# Patient Record
Sex: Female | Born: 1960 | Race: White | Hispanic: No | Smoking: Never smoker
Health system: Southern US, Community
[De-identification: ages and names within clinical notes are randomized; demographics above are authoritative.]

---

## 2012-06-10 DIAGNOSIS — IMO0001 Reserved for inherently not codable concepts without codable children: Secondary | ICD-10-CM | POA: Insufficient documentation

## 2012-07-29 DIAGNOSIS — S31000A Unspecified open wound of lower back and pelvis without penetration into retroperitoneum, initial encounter: Secondary | ICD-10-CM | POA: Insufficient documentation

## 2013-03-10 DIAGNOSIS — H40059 Ocular hypertension, unspecified eye: Secondary | ICD-10-CM | POA: Insufficient documentation

## 2016-08-21 DIAGNOSIS — R7989 Other specified abnormal findings of blood chemistry: Secondary | ICD-10-CM | POA: Insufficient documentation

## 2016-08-21 DIAGNOSIS — R5383 Other fatigue: Secondary | ICD-10-CM | POA: Insufficient documentation

## 2016-08-21 DIAGNOSIS — R112 Nausea with vomiting, unspecified: Secondary | ICD-10-CM | POA: Insufficient documentation

## 2016-08-21 DIAGNOSIS — R1033 Periumbilical pain: Secondary | ICD-10-CM | POA: Insufficient documentation

## 2016-10-02 DIAGNOSIS — K760 Fatty (change of) liver, not elsewhere classified: Secondary | ICD-10-CM | POA: Insufficient documentation

## 2018-06-05 DIAGNOSIS — M25552 Pain in left hip: Secondary | ICD-10-CM | POA: Diagnosis not present

## 2018-06-05 DIAGNOSIS — M545 Low back pain: Secondary | ICD-10-CM | POA: Diagnosis not present

## 2018-07-01 DIAGNOSIS — M25552 Pain in left hip: Secondary | ICD-10-CM | POA: Insufficient documentation

## 2018-07-03 DIAGNOSIS — M25552 Pain in left hip: Secondary | ICD-10-CM | POA: Diagnosis not present

## 2018-07-25 DIAGNOSIS — Z1231 Encounter for screening mammogram for malignant neoplasm of breast: Secondary | ICD-10-CM | POA: Diagnosis not present

## 2018-07-25 DIAGNOSIS — Z6828 Body mass index (BMI) 28.0-28.9, adult: Secondary | ICD-10-CM | POA: Diagnosis not present

## 2018-07-25 DIAGNOSIS — Z0000 Encounter for general adult medical examination without abnormal findings: Secondary | ICD-10-CM | POA: Diagnosis not present

## 2018-07-25 DIAGNOSIS — Z01419 Encounter for gynecological examination (general) (routine) without abnormal findings: Secondary | ICD-10-CM | POA: Diagnosis not present

## 2018-09-05 DIAGNOSIS — M25552 Pain in left hip: Secondary | ICD-10-CM | POA: Diagnosis not present

## 2018-12-04 DIAGNOSIS — Z23 Encounter for immunization: Secondary | ICD-10-CM | POA: Diagnosis not present

## 2018-12-12 DIAGNOSIS — M25552 Pain in left hip: Secondary | ICD-10-CM | POA: Diagnosis not present

## 2018-12-12 DIAGNOSIS — M545 Low back pain: Secondary | ICD-10-CM | POA: Diagnosis not present

## 2018-12-16 ENCOUNTER — Other Ambulatory Visit: Payer: Self-pay | Admitting: Orthopedic Surgery

## 2018-12-16 ENCOUNTER — Telehealth: Payer: Self-pay

## 2018-12-16 DIAGNOSIS — G8929 Other chronic pain: Secondary | ICD-10-CM

## 2018-12-16 DIAGNOSIS — M545 Low back pain, unspecified: Secondary | ICD-10-CM

## 2018-12-16 NOTE — Telephone Encounter (Signed)
Patient returned my call to give [place] a medication list and drug allergy information before being scheduled for a myelogram.  She was informed she will be here two hours, needs a drive,r will need to be on strict bedrest for 24 hours after the procedure and that she does not need to hold any medications for this procedure.

## 2018-12-26 ENCOUNTER — Other Ambulatory Visit: Payer: Self-pay

## 2018-12-26 ENCOUNTER — Ambulatory Visit
Admission: RE | Admit: 2018-12-26 | Discharge: 2018-12-26 | Disposition: A | Discharge status: Home / Self Care | Payer: BC Managed Care – PPO | Source: Ambulatory Visit | Attending: Orthopedic Surgery | Admitting: Orthopedic Surgery

## 2018-12-26 DIAGNOSIS — M4316 Spondylolisthesis, lumbar region: Secondary | ICD-10-CM | POA: Diagnosis not present

## 2018-12-26 DIAGNOSIS — G8929 Other chronic pain: Secondary | ICD-10-CM

## 2018-12-26 DIAGNOSIS — M5126 Other intervertebral disc displacement, lumbar region: Secondary | ICD-10-CM | POA: Diagnosis not present

## 2018-12-26 DIAGNOSIS — M48061 Spinal stenosis, lumbar region without neurogenic claudication: Secondary | ICD-10-CM | POA: Diagnosis not present

## 2018-12-26 DIAGNOSIS — M545 Low back pain, unspecified: Secondary | ICD-10-CM

## 2018-12-26 MED ORDER — DIAZEPAM 5 MG PO TABS
5.0000 mg | ORAL_TABLET | Freq: Once | ORAL | Status: AC
  Administered 2018-12-26: 5 mg via ORAL

## 2018-12-26 MED ORDER — IOPAMIDOL (ISOVUE-M 200) INJECTION 41%: 15.0000 mL | Freq: Once | INTRAMUSCULAR | Status: DC

## 2018-12-26 NOTE — Discharge Instructions (Signed)

## 2019-01-02 DIAGNOSIS — M48061 Spinal stenosis, lumbar region without neurogenic claudication: Secondary | ICD-10-CM | POA: Insufficient documentation

## 2019-01-05 DIAGNOSIS — M48061 Spinal stenosis, lumbar region without neurogenic claudication: Secondary | ICD-10-CM | POA: Diagnosis not present

## 2019-01-05 DIAGNOSIS — M25552 Pain in left hip: Secondary | ICD-10-CM | POA: Diagnosis not present

## 2019-01-28 DIAGNOSIS — M25552 Pain in left hip: Secondary | ICD-10-CM | POA: Diagnosis not present

## 2019-01-28 DIAGNOSIS — M1612 Unilateral primary osteoarthritis, left hip: Secondary | ICD-10-CM | POA: Diagnosis not present

## 2019-03-05 DIAGNOSIS — Z01812 Encounter for preprocedural laboratory examination: Secondary | ICD-10-CM | POA: Diagnosis not present

## 2019-03-05 DIAGNOSIS — Z01818 Encounter for other preprocedural examination: Secondary | ICD-10-CM | POA: Diagnosis not present

## 2019-03-20 DIAGNOSIS — Z96651 Presence of right artificial knee joint: Secondary | ICD-10-CM | POA: Diagnosis not present

## 2019-03-20 DIAGNOSIS — M1612 Unilateral primary osteoarthritis, left hip: Secondary | ICD-10-CM | POA: Diagnosis not present

## 2019-05-01 ENCOUNTER — Ambulatory Visit: Payer: BC Managed Care – PPO | Attending: Internal Medicine

## 2019-05-01 DIAGNOSIS — Z471 Aftercare following joint replacement surgery: Secondary | ICD-10-CM | POA: Diagnosis not present

## 2019-05-01 DIAGNOSIS — Z96642 Presence of left artificial hip joint: Secondary | ICD-10-CM | POA: Diagnosis not present

## 2019-05-01 DIAGNOSIS — Z23 Encounter for immunization: Secondary | ICD-10-CM

## 2019-05-01 NOTE — Progress Notes (Signed)
   Covid-19 Vaccination Clinic  Name:  Isabel Wood    MRN: [id number] DOB: December 05, 1960  05/01/2019  Isabel Wood was observed post Covid-19 immunization for 15 minutes without incident. She was provided with Vaccine Information Sheet and instruction to access the V-Safe system.   Isabel Wood was instructed to call 911 with any severe reactions post vaccine: Marland Kitchen Difficulty breathing  . Swelling of face and throat  . A fast heartbeat  . A bad rash all over body  . Dizziness and weakness   Immunizations Administered    Name Date Dose VIS Date Route   Pfizer COVID-19 Vaccine 05/01/2019 11:46 AM 0.3 mL 01/30/2019 Intramuscular   Manufacturer: [employer], [employer]   Lot: [id number]   NDC: [address]-7

## 2019-05-25 ENCOUNTER — Ambulatory Visit: Payer: BC Managed Care – PPO | Attending: Internal Medicine

## 2019-05-25 DIAGNOSIS — Z23 Encounter for immunization: Secondary | ICD-10-CM

## 2019-05-25 NOTE — Progress Notes (Signed)
   Covid-19 Vaccination Clinic  Name:  Isabel Wood    MRN: [id number] DOB: May 30, 1960  05/25/2019  [name] was observed post Covid-19 immunization for 15 minutes without incident. She was provided with Vaccine Information Sheet and instruction to access the V-Safe system.   [name] was instructed to call 911 with any severe reactions post vaccine: Marland Kitchen Difficulty breathing  . Swelling of face and throat  . A fast heartbeat  . A bad rash all over body  . Dizziness and weakness   Immunizations Administered    Name Date Dose VIS Date Route   Pfizer COVID-19 Vaccine 05/25/2019  2:52 PM 0.3 mL 01/30/2019 Intramuscular   Manufacturer: [employer], [employer]   Lot: [id number]   NDC: [address]-2

## 2019-11-04 DIAGNOSIS — Z0000 Encounter for general adult medical examination without abnormal findings: Secondary | ICD-10-CM | POA: Diagnosis not present

## 2019-11-04 DIAGNOSIS — F419 Anxiety disorder, unspecified: Secondary | ICD-10-CM | POA: Diagnosis not present

## 2019-11-04 DIAGNOSIS — R0981 Nasal congestion: Secondary | ICD-10-CM | POA: Diagnosis not present

## 2019-12-01 DIAGNOSIS — F419 Anxiety disorder, unspecified: Secondary | ICD-10-CM | POA: Diagnosis not present

## 2019-12-01 DIAGNOSIS — R0981 Nasal congestion: Secondary | ICD-10-CM | POA: Diagnosis not present

## 2019-12-11 IMAGING — CT CT L SPINE W/ CM
1 of 6 series · 6 of 14 positions shown, 8 images · non-contrast
Comparison: No comparison

CLINICAL DATA: Chronic low back pain. Lower extremity radicular
pain involving L4. Involvement L3.
TECHNIQUE: Contiguous axial images were obtained through the Lumbar spine after
the intrathecal infusion of infusion. Coronal and sagittal
reconstructions were obtained of the axial image sets.

[Series 3: l spine soft · axial · 0.34mm/px · z∈[-354,-180]mm · 6 of 82 slices shown, 8 images]
[im 12/82  soft-tissue]
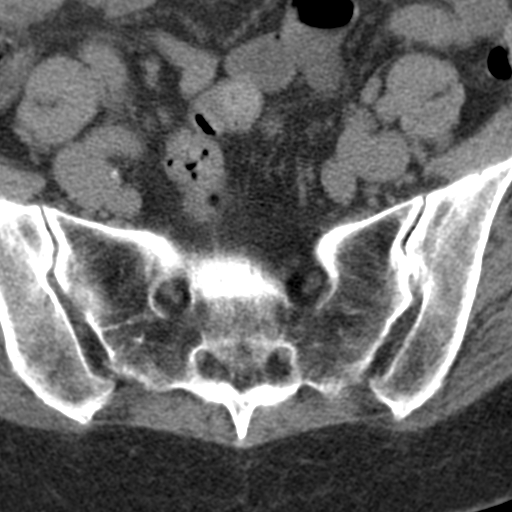
[im 12/82  bone]
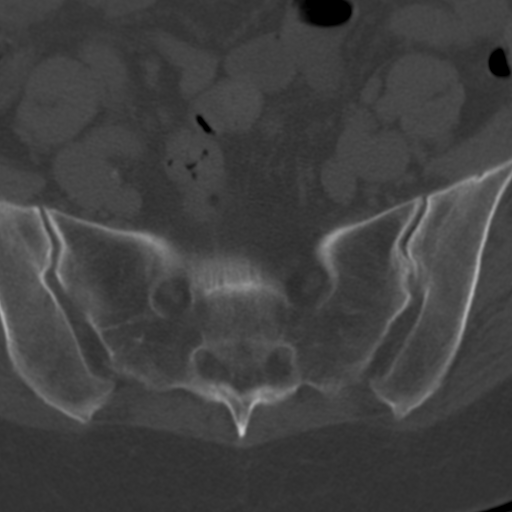
[im 24/82  bone]
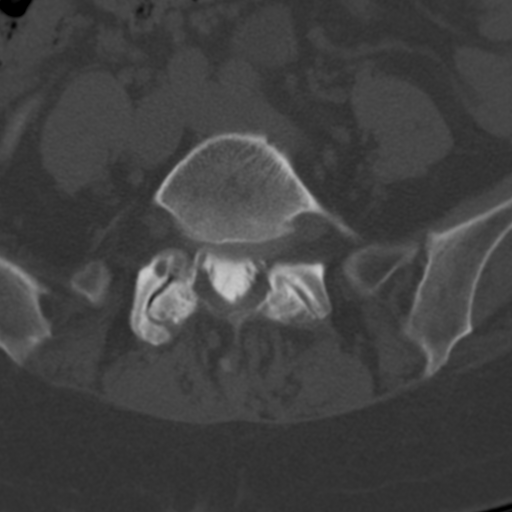
[im 35/82  bone]
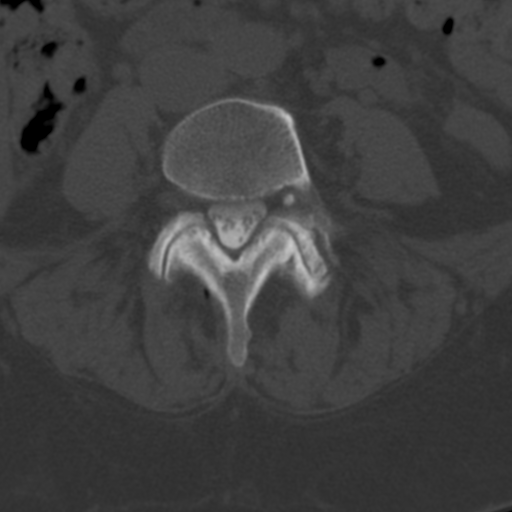
[im 47/82  bone]
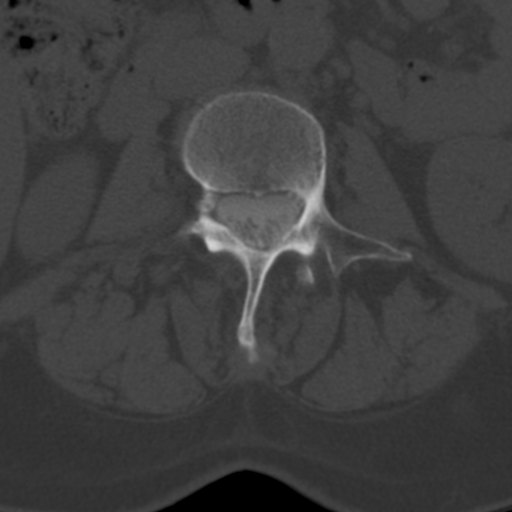
[im 58/82  soft-tissue]
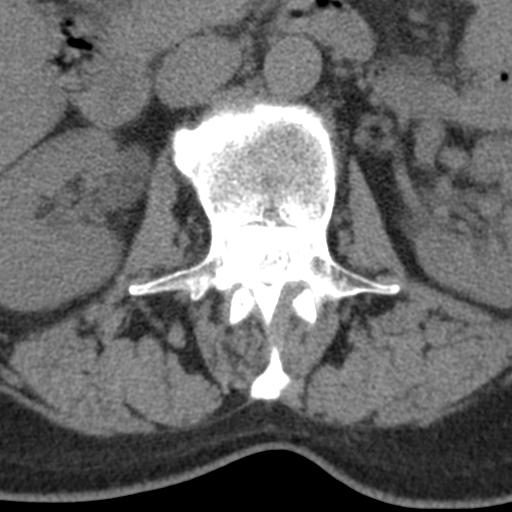
[im 58/82  bone]
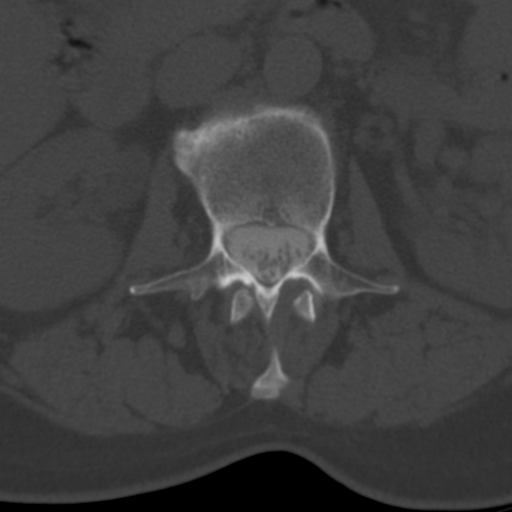
[im 70/82  bone]
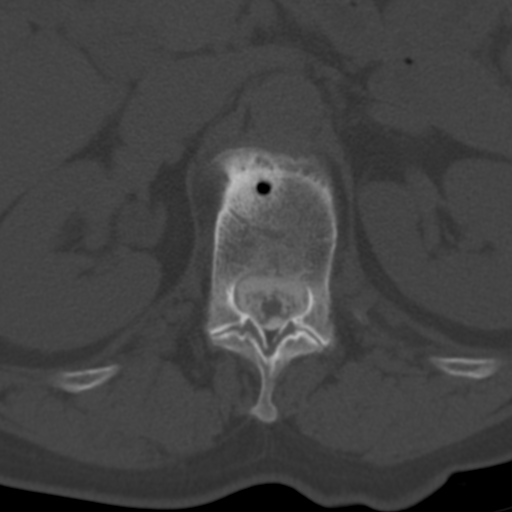

[6 of 14 positions shown; findings below may reference images not displayed]

EXAM:
LUMBAR MYELOGRAM

FLUOROSCOPY TIME:  Radiation Exposure Index (as provided by the
fluoroscopic device): 412.77 uGy*m2

PROCEDURE:
After thorough discussion of risks and benefits of the procedure
including bleeding, infection, injury to nerves, blood vessels,
adjacent structures as well as headache and CSF leak, written and
oral informed consent was obtained. Consent was obtained by Dr.
Puumue Norese. Time out form was completed.

Patient was positioned prone on the fluoroscopy table. Local
anesthesia was provided with 1% lidocaine without epinephrine after
prepped and draped in the usual sterile fashion. Puncture was
performed at L3-4 using a 3 1/2 inch 22-gauge spinal needle via
right paramedian approach. Using a single pass through the dura, the
needle was placed within the thecal sac, with return of clear CSF.
15 mL of Isovue [id number]-[id number] was injected into the thecal sac, with normal
opacification of the nerve roots and cauda equina consistent with
free flow within the subarachnoid space.

I personally performed the lumbar puncture and administered the
intrathecal contrast. I also personally supervised acquisition of
the myelogram images.
FINDINGS: LUMBAR MYELOGRAM FINDINGS:

Five non rib-bearing lumbar type vertebral bodies are present.
Levoconvex curvature is centered at L1-2. Asymmetric endplate
changes are present on right at L2-3 right subarticular narrowing.
Asymmetric endplate changes are present right at L1-2 as well. Is
grade 1 retrolisthesis at L1-2 this does not change significantly
with standing, flexion or extension

Slight anterolisthesis is present at L4-5 L5-S1. The anterolisthesis
at L4-5 is slightly worse with flexion.

Mild subarticular narrowing is present bilaterally at L4-5 left
greater than right there is asymmetric narrowing on the left at
L5-S1, likely impacting the S1 nerve roots.

CT LUMBAR MYELOGRAM FINDINGS:

Lumbar spine is imaged from midbody of T12 through S3-4. Levoconvex
curvature is centered at L1-2. Rightward curvature is centered at
L4-5. Sclerotic endplate changes and subchondral cysts are worse
right than left at L1-2. More mild right-sided changes are present
at L2-3. Left lateral sclerotic changes are present at L4-5. There
is lateral sclerosis bilaterally at L5-S1. There is a vacuum disc at
L2-3, L4-5, and L5-S1.

Atherosclerotic changes are noted in the aorta and branch vessels.
Focal intra-abdominal lesions are present.

L1-2: A rightward disc protrusion is present. Advanced facet
hypertrophy is noted this leads to mild right subarticular
narrowing. Moderate right and mild left foraminal stenosis is
present.

L2-3: A broad-based disc protrusion is present. Advanced facet
hypertrophy is worse on the right. Mild right subarticular narrowing
is present. There is mild foraminal narrowing bilaterally, right
greater than left.

L3-4: A broad-based disc protrusion is present. Advanced facet
hypertrophy is worse left than right. There is mild central canal
narrowing. Mild foraminal narrowing is worse right than left.

L4-5: A broad-based disc protrusion is present. Moderate facet
hypertrophy is worse on the left. The central canal is patent.
Moderate foraminal narrowing is worse right than left.

L5-S1: Asymmetric right-sided facet hypertrophy is present. There is
disc bulging to the left. Mild left subarticular narrowing is
present, potentially impacting the left S1 nerve roots. Mild
foraminal narrowing is worse left than right.
IMPRESSION: 1. Multilevel spondylosis of the lumbar spine as described.
2. Lumbar curvature is asymmetric to the left at L1-2 and to the
right at L4-5 with associated degenerative changes along the
concavities.
3. Asymmetric leftward disc protrusion and mild left subarticular
narrowing at L5-S1.
4. Mild foraminal narrowing at L5-S1 is worse on the left.
5. Grade 1 anterolisthesis with uncovering of a broad-based disc
protrusion resulting in moderate bilateral foraminal narrowing at
L4-5. There is exaggerated anterolisthesis with flexion.
6. Mild central and foraminal narrowing at L3-4 is worse on the
right.
7. Mild right subarticular and right greater than left foraminal
narrowing at L1-2 and L2-3.
8.  Aortic Atherosclerosis ([id number]).

## 2019-12-24 DIAGNOSIS — R519 Headache, unspecified: Secondary | ICD-10-CM | POA: Diagnosis not present

## 2019-12-24 DIAGNOSIS — S0031XA Abrasion of nose, initial encounter: Secondary | ICD-10-CM | POA: Diagnosis not present

## 2019-12-24 DIAGNOSIS — S0990XA Unspecified injury of head, initial encounter: Secondary | ICD-10-CM | POA: Diagnosis not present

## 2019-12-24 DIAGNOSIS — W19XXXA Unspecified fall, initial encounter: Secondary | ICD-10-CM | POA: Diagnosis not present

## 2020-01-22 DIAGNOSIS — L719 Rosacea, unspecified: Secondary | ICD-10-CM | POA: Diagnosis not present

## 2020-01-22 DIAGNOSIS — L814 Other melanin hyperpigmentation: Secondary | ICD-10-CM | POA: Diagnosis not present

## 2020-01-22 DIAGNOSIS — L821 Other seborrheic keratosis: Secondary | ICD-10-CM | POA: Diagnosis not present

## 2020-01-22 DIAGNOSIS — D229 Melanocytic nevi, unspecified: Secondary | ICD-10-CM | POA: Diagnosis not present

## 2020-03-31 DIAGNOSIS — F419 Anxiety disorder, unspecified: Secondary | ICD-10-CM | POA: Diagnosis not present

## 2020-04-08 DIAGNOSIS — Z96642 Presence of left artificial hip joint: Secondary | ICD-10-CM | POA: Diagnosis not present

## 2020-05-04 DIAGNOSIS — F419 Anxiety disorder, unspecified: Secondary | ICD-10-CM | POA: Diagnosis not present

## 2020-05-04 DIAGNOSIS — F321 Major depressive disorder, single episode, moderate: Secondary | ICD-10-CM | POA: Diagnosis not present

## 2020-06-20 DIAGNOSIS — F419 Anxiety disorder, unspecified: Secondary | ICD-10-CM | POA: Diagnosis not present

## 2020-06-20 DIAGNOSIS — F321 Major depressive disorder, single episode, moderate: Secondary | ICD-10-CM | POA: Diagnosis not present

## 2020-06-20 DIAGNOSIS — Z79891 Long term (current) use of opiate analgesic: Secondary | ICD-10-CM | POA: Diagnosis not present

## 2020-06-27 DIAGNOSIS — M25571 Pain in right ankle and joints of right foot: Secondary | ICD-10-CM | POA: Diagnosis not present

## 2020-06-27 DIAGNOSIS — R0781 Pleurodynia: Secondary | ICD-10-CM | POA: Insufficient documentation

## 2020-06-27 DIAGNOSIS — M79671 Pain in right foot: Secondary | ICD-10-CM | POA: Insufficient documentation

## 2020-06-27 DIAGNOSIS — S93401A Sprain of unspecified ligament of right ankle, initial encounter: Secondary | ICD-10-CM | POA: Diagnosis not present

## 2020-07-11 DIAGNOSIS — M25571 Pain in right ankle and joints of right foot: Secondary | ICD-10-CM | POA: Diagnosis not present

## 2020-07-11 DIAGNOSIS — R0781 Pleurodynia: Secondary | ICD-10-CM | POA: Diagnosis not present

## 2020-07-11 DIAGNOSIS — M79671 Pain in right foot: Secondary | ICD-10-CM | POA: Diagnosis not present

## 2020-07-19 DIAGNOSIS — F419 Anxiety disorder, unspecified: Secondary | ICD-10-CM | POA: Diagnosis not present

## 2020-07-19 DIAGNOSIS — F321 Major depressive disorder, single episode, moderate: Secondary | ICD-10-CM | POA: Diagnosis not present

## 2020-07-25 DIAGNOSIS — M79671 Pain in right foot: Secondary | ICD-10-CM | POA: Diagnosis not present

## 2020-08-15 DIAGNOSIS — M25571 Pain in right ankle and joints of right foot: Secondary | ICD-10-CM | POA: Diagnosis not present

## 2020-08-15 DIAGNOSIS — F900 Attention-deficit hyperactivity disorder, predominantly inattentive type: Secondary | ICD-10-CM | POA: Diagnosis not present

## 2020-08-15 DIAGNOSIS — M79671 Pain in right foot: Secondary | ICD-10-CM | POA: Diagnosis not present

## 2020-08-15 DIAGNOSIS — F321 Major depressive disorder, single episode, moderate: Secondary | ICD-10-CM | POA: Diagnosis not present

## 2020-08-15 DIAGNOSIS — F419 Anxiety disorder, unspecified: Secondary | ICD-10-CM | POA: Diagnosis not present

## 2020-09-12 DIAGNOSIS — F3341 Major depressive disorder, recurrent, in partial remission: Secondary | ICD-10-CM | POA: Diagnosis not present

## 2020-09-12 DIAGNOSIS — F419 Anxiety disorder, unspecified: Secondary | ICD-10-CM | POA: Diagnosis not present

## 2020-09-12 DIAGNOSIS — F900 Attention-deficit hyperactivity disorder, predominantly inattentive type: Secondary | ICD-10-CM | POA: Diagnosis not present

## 2020-11-07 DIAGNOSIS — F3341 Major depressive disorder, recurrent, in partial remission: Secondary | ICD-10-CM | POA: Diagnosis not present

## 2020-11-07 DIAGNOSIS — F419 Anxiety disorder, unspecified: Secondary | ICD-10-CM | POA: Diagnosis not present

## 2020-11-07 DIAGNOSIS — F900 Attention-deficit hyperactivity disorder, predominantly inattentive type: Secondary | ICD-10-CM | POA: Diagnosis not present

## 2020-12-12 DIAGNOSIS — F419 Anxiety disorder, unspecified: Secondary | ICD-10-CM | POA: Diagnosis not present

## 2020-12-12 DIAGNOSIS — F3341 Major depressive disorder, recurrent, in partial remission: Secondary | ICD-10-CM | POA: Diagnosis not present

## 2020-12-12 DIAGNOSIS — F900 Attention-deficit hyperactivity disorder, predominantly inattentive type: Secondary | ICD-10-CM | POA: Diagnosis not present

## 2020-12-21 DIAGNOSIS — F3341 Major depressive disorder, recurrent, in partial remission: Secondary | ICD-10-CM | POA: Diagnosis not present

## 2020-12-21 DIAGNOSIS — F419 Anxiety disorder, unspecified: Secondary | ICD-10-CM | POA: Diagnosis not present

## 2021-01-05 DIAGNOSIS — F321 Major depressive disorder, single episode, moderate: Secondary | ICD-10-CM | POA: Diagnosis not present

## 2021-01-05 DIAGNOSIS — F419 Anxiety disorder, unspecified: Secondary | ICD-10-CM | POA: Diagnosis not present

## 2021-01-11 DIAGNOSIS — Z1231 Encounter for screening mammogram for malignant neoplasm of breast: Secondary | ICD-10-CM | POA: Diagnosis not present

## 2021-01-23 DIAGNOSIS — D229 Melanocytic nevi, unspecified: Secondary | ICD-10-CM | POA: Diagnosis not present

## 2021-01-23 DIAGNOSIS — L821 Other seborrheic keratosis: Secondary | ICD-10-CM | POA: Diagnosis not present

## 2021-01-23 DIAGNOSIS — L738 Other specified follicular disorders: Secondary | ICD-10-CM | POA: Diagnosis not present

## 2021-01-23 DIAGNOSIS — T07XXXA Unspecified multiple injuries, initial encounter: Secondary | ICD-10-CM | POA: Diagnosis not present

## 2021-02-02 DIAGNOSIS — F419 Anxiety disorder, unspecified: Secondary | ICD-10-CM | POA: Diagnosis not present

## 2021-02-02 DIAGNOSIS — F900 Attention-deficit hyperactivity disorder, predominantly inattentive type: Secondary | ICD-10-CM | POA: Diagnosis not present

## 2021-02-02 DIAGNOSIS — F3341 Major depressive disorder, recurrent, in partial remission: Secondary | ICD-10-CM | POA: Diagnosis not present

## 2021-02-06 DIAGNOSIS — F900 Attention-deficit hyperactivity disorder, predominantly inattentive type: Secondary | ICD-10-CM | POA: Diagnosis not present

## 2021-02-06 DIAGNOSIS — F3341 Major depressive disorder, recurrent, in partial remission: Secondary | ICD-10-CM | POA: Diagnosis not present

## 2021-02-06 DIAGNOSIS — F419 Anxiety disorder, unspecified: Secondary | ICD-10-CM | POA: Diagnosis not present

## 2021-03-09 DIAGNOSIS — F900 Attention-deficit hyperactivity disorder, predominantly inattentive type: Secondary | ICD-10-CM | POA: Diagnosis not present

## 2021-03-09 DIAGNOSIS — F3341 Major depressive disorder, recurrent, in partial remission: Secondary | ICD-10-CM | POA: Diagnosis not present

## 2021-03-09 DIAGNOSIS — F419 Anxiety disorder, unspecified: Secondary | ICD-10-CM | POA: Diagnosis not present

## 2021-03-13 DIAGNOSIS — Z13220 Encounter for screening for lipoid disorders: Secondary | ICD-10-CM | POA: Diagnosis not present

## 2021-03-13 DIAGNOSIS — Z124 Encounter for screening for malignant neoplasm of cervix: Secondary | ICD-10-CM | POA: Diagnosis not present

## 2021-03-13 DIAGNOSIS — Z1151 Encounter for screening for human papillomavirus (HPV): Secondary | ICD-10-CM | POA: Diagnosis not present

## 2021-03-13 DIAGNOSIS — Z6832 Body mass index (BMI) 32.0-32.9, adult: Secondary | ICD-10-CM | POA: Diagnosis not present

## 2021-03-13 DIAGNOSIS — Z0000 Encounter for general adult medical examination without abnormal findings: Secondary | ICD-10-CM | POA: Diagnosis not present

## 2021-03-13 DIAGNOSIS — Z01419 Encounter for gynecological examination (general) (routine) without abnormal findings: Secondary | ICD-10-CM | POA: Diagnosis not present

## 2021-03-14 DIAGNOSIS — F3341 Major depressive disorder, recurrent, in partial remission: Secondary | ICD-10-CM | POA: Diagnosis not present

## 2021-03-14 DIAGNOSIS — F419 Anxiety disorder, unspecified: Secondary | ICD-10-CM | POA: Diagnosis not present

## 2021-04-04 DIAGNOSIS — Z13820 Encounter for screening for osteoporosis: Secondary | ICD-10-CM | POA: Diagnosis not present

## 2021-04-06 DIAGNOSIS — F419 Anxiety disorder, unspecified: Secondary | ICD-10-CM | POA: Diagnosis not present

## 2021-04-06 DIAGNOSIS — F900 Attention-deficit hyperactivity disorder, predominantly inattentive type: Secondary | ICD-10-CM | POA: Diagnosis not present

## 2021-04-06 DIAGNOSIS — F3341 Major depressive disorder, recurrent, in partial remission: Secondary | ICD-10-CM | POA: Diagnosis not present

## 2021-04-12 DIAGNOSIS — F900 Attention-deficit hyperactivity disorder, predominantly inattentive type: Secondary | ICD-10-CM | POA: Diagnosis not present

## 2021-04-12 DIAGNOSIS — F330 Major depressive disorder, recurrent, mild: Secondary | ICD-10-CM | POA: Diagnosis not present

## 2021-05-04 DIAGNOSIS — F330 Major depressive disorder, recurrent, mild: Secondary | ICD-10-CM | POA: Diagnosis not present

## 2021-05-04 DIAGNOSIS — F900 Attention-deficit hyperactivity disorder, predominantly inattentive type: Secondary | ICD-10-CM | POA: Diagnosis not present

## 2021-05-18 DIAGNOSIS — F900 Attention-deficit hyperactivity disorder, predominantly inattentive type: Secondary | ICD-10-CM | POA: Diagnosis not present

## 2021-05-18 DIAGNOSIS — F330 Major depressive disorder, recurrent, mild: Secondary | ICD-10-CM | POA: Diagnosis not present

## 2021-05-18 DIAGNOSIS — F419 Anxiety disorder, unspecified: Secondary | ICD-10-CM | POA: Diagnosis not present

## 2021-06-02 DIAGNOSIS — F330 Major depressive disorder, recurrent, mild: Secondary | ICD-10-CM | POA: Diagnosis not present

## 2021-06-02 DIAGNOSIS — F900 Attention-deficit hyperactivity disorder, predominantly inattentive type: Secondary | ICD-10-CM | POA: Diagnosis not present

## 2021-07-03 DIAGNOSIS — F330 Major depressive disorder, recurrent, mild: Secondary | ICD-10-CM | POA: Diagnosis not present

## 2021-07-03 DIAGNOSIS — F900 Attention-deficit hyperactivity disorder, predominantly inattentive type: Secondary | ICD-10-CM | POA: Diagnosis not present

## 2021-07-10 ENCOUNTER — Ambulatory Visit: Payer: BC Managed Care – PPO | Admitting: Neurology

## 2021-07-13 ENCOUNTER — Encounter: Payer: Self-pay | Admitting: Neurology

## 2021-07-13 ENCOUNTER — Ambulatory Visit: Payer: BC Managed Care – PPO | Admitting: Neurology

## 2021-07-13 VITALS — BP 107/72 | HR 96 | Ht 68.0 in | Wt 230.5 lb

## 2021-07-13 DIAGNOSIS — F419 Anxiety disorder, unspecified: Secondary | ICD-10-CM

## 2021-07-13 DIAGNOSIS — F32A Depression, unspecified: Secondary | ICD-10-CM | POA: Diagnosis not present

## 2021-07-13 DIAGNOSIS — R413 Other amnesia: Secondary | ICD-10-CM | POA: Diagnosis not present

## 2021-07-13 DIAGNOSIS — F988 Other specified behavioral and emotional disorders with onset usually occurring in childhood and adolescence: Secondary | ICD-10-CM

## 2021-07-13 NOTE — Progress Notes (Signed)
[facility]  PATIENT: Isabel Wood DOB: Jun 29, 1960  REQUESTING CLINICIAN: Merian CapronFriedman, Marian, NP HISTORY FROM: Patient  REASON FOR VISIT: Memory decline    HISTORICAL  CHIEF COMPLAINT:  Chief Complaint  Patient presents with   New Patient (Initial Visit)    Rm 12. Alone. NP/Paper/Marian Zachery ConchFriedman NP [facility]/Progressive memory decline. Moca 27/30.    HISTORY OF PRESENT ILLNESS:  This is a 61 year old woman past medical history of anxiety and ADHD who is presenting with memory concern for the past year and a half 2 years ago.  She has been following up with psychiatry in regard of anxiety and ADHD.  She mention lately she has trouble with detail orientation which started to impact her job.  She said her job is very detail oriented, she worked for the [employer] company.  She has trouble with her meeting at the end of the day, she has decrease in productivity, she said during her meeting she sometimes has inability to answer questions which is again causing her high stress.  She mentioned that coworker had noted some mistake in her inability to follow along in meetings. She also reports a sense of being forgetful and repeating herself. There is also report of inability to focus to get things done around the house.  She reports her short-term memory is for the most part okay, she is still independent, lives by herself, independent in all activities of daily living, still drives, denies being lost in familiar places.   TBI:   No past history of TBI Stroke:   no past history of stroke Seizures:   no past history of seizures Sleep:   no history of sleep apnea.  Mood:  patient denies anxiety and depression  Functional status: independent in all ADLs and IADLs Patient lives alone.   Cooking: Patient  Cleaning: Patient  Shopping: Patient  Bathing: Patient Toileting: Patient  Driving: Patient, denies being loss in familiar place, no recent accidents Bills:  Patient  Medications: Antidepressant and Amphetamine/Dextroamphetamine  Ever left the stove on by accident?: Forget to take food out of the [employer]  Forget how to use items around the house?: no Getting lost going to familiar places?: no Forgetting loved ones names?:  Struggle with names  Word finding difficulty? Yes  Sleep: Good    OTHER MEDICAL CONDITIONS: Anxiety and ADHD    REVIEW OF SYSTEMS: Full 14 system review of systems performed and negative with exception of: as noted in the HPI   ALLERGIES: No Known Allergies  HOME MEDICATIONS: Outpatient Medications Prior to Visit  Medication Sig Dispense Refill   amphetamine-dextroamphetamine (ADDERALL XR) 10 MG 24 hr capsule Take 10 mg by mouth every morning.     amphetamine-dextroamphetamine (ADDERALL XR) 20 MG 24 hr capsule Take 20 mg by mouth 2 (two) times daily.     ARIPiprazole (ABILIFY) 5 MG tablet Take 5 mg by mouth daily.     buPROPion (WELLBUTRIN XL) 300 MG 24 hr tablet Take 300 mg by mouth daily.     acetaminophen (TYLENOL) 500 MG tablet Take 1,000 mg by mouth every 8 (eight) hours as needed.     gabapentin (NEURONTIN) 300 MG capsule Take 600 mg by mouth at bedtime.     gabapentin (NEURONTIN) 300 MG capsule Take 300 mg by mouth every morning.     indomethacin (INDOCIN) 25 MG capsule Take 25 mg by mouth 2 (two) times daily with a meal.     No facility-administered medications prior to visit.  PAST MEDICAL HISTORY: History reviewed. No pertinent past medical history.  PAST SURGICAL HISTORY: History reviewed. No pertinent surgical history.  FAMILY HISTORY: History reviewed. No pertinent family history.  SOCIAL HISTORY: Social History   Socioeconomic History   Marital status: Single    Spouse name: Not on file   Number of children: Not on file   Years of education: Not on file   Highest education level: Not on file  Occupational History   Not on file  Tobacco Use   Smoking status: Not on file   Smokeless  tobacco: Not on file  Substance and Sexual Activity   Alcohol use: Not on file   Drug use: Not on file   Sexual activity: Not on file  Other Topics Concern   Not on file  Social History Narrative   Not on file   Social Determinants of Health   Financial Resource Strain: Not on file  Food Insecurity: Not on file  Transportation Needs: Not on file  Physical Activity: Not on file  Stress: Not on file  Social Connections: Not on file  Intimate Partner Violence: Not on file    PHYSICAL EXAM  GENERAL EXAM/CONSTITUTIONAL: Vitals:  Vitals:   07/13/21 1409  BP: 107/72  Pulse: 96  Weight: 230 lb 8 oz (104.6 kg)  Height: 5\' 8"  (1.727 m)   Body mass index is 35.05 kg/m. Wt Readings from Last 3 Encounters:  07/13/21 230 lb 8 oz (104.6 kg)   Patient is in no distress; well developed, nourished and groomed; neck is supple  EYES: Pupils round and reactive to light, Visual fields full to confrontation, Extraocular movements intacts,   MUSCULOSKELETAL: Gait, strength, tone, movements noted in Neurologic exam below  NEUROLOGIC: MENTAL STATUS:      View : No data to display.            07/13/2021    2:12 PM  Montreal Cognitive Assessment   Visuospatial/ Executive (0/5) 5  Naming (0/3) 3  Attention: Read list of digits (0/2) 2  Attention: Read list of letters (0/1) 1  Attention: Serial 7 subtraction starting at 100 (0/3) 3  Language: Repeat phrase (0/2) 2  Language : Fluency (0/1) 1  Abstraction (0/2) 2  Delayed Recall (0/5) 2  Orientation (0/6) 6  Total 27  Adjusted Score (based on education) 27     CRANIAL NERVE:  2nd, 3rd, 4th, 6th - pupils equal and reactive to light, visual fields full to confrontation, extraocular muscles intact, no nystagmus 5th - facial sensation symmetric 7th - facial strength symmetric 8th - hearing intact 9th - palate elevates symmetrically, uvula midline 11th - shoulder shrug symmetric 12th - tongue protrusion midline  MOTOR:   normal bulk and tone, full strength in the BUE, BLE  SENSORY:  normal and symmetric to light touch, vibration  COORDINATION:  finger-nose-finger, fine finger movements normal  REFLEXES:  deep tendon reflexes present and symmetric  GAIT/STATION:  normal   DIAGNOSTIC DATA (LABS, IMAGING, TESTING) - I reviewed patient records, labs, notes, testing and imaging myself where available.  No results found for: WBC, HGB, HCT, MCV, PLT No results found for: NA, K, CL, CO2, GLUCOSE, BUN, CREATININE, CALCIUM, PROT, ALBUMIN, AST, ALT, ALKPHOS, BILITOT, GFRNONAA, GFRAA No results found for: CHOL, HDL, LDLCALC, LDLDIRECT, TRIG, CHOLHDL No results found for: 05-25-1996 No results found for: VITAMINB12 No results found for: TSH    ASSESSMENT AND PLAN  61 y.o. year old female with anxiety/depression, ADD who is presenting with  memory decline for the past year and a half to years.  She reported memory decline as trouble with concentration, difficulty in a job which she is highly detail oriented.  She does report decrease in productivity.  On exam today she scored 27 out of 30 on the Moca which is normal.  Patient does have a history of anxiety/depression and ADD for which she takes amphetamine/dextroamphetamine, total of 50 mg daily.  I informed patient that at this point I am not sure if her complaints are related to her ADD or it is mild cognitive impairment.  She denies any family history of dementia.  At this point, I will refer her to neuropsych for formal testing, hopefully this will help [place] confirm the diagnosis.  I will see her in 1 year for follow-up    1. Memory change      Patient Instructions  Continue current medication Dementia lab today Referral to neuropsychiatry Follow-up in 1 year  Orders Placed This Encounter  Procedures   TSH   Vitamin B12   Ambulatory referral to Neuropsychology    No orders of the defined types were placed in this encounter.   Return in about 1  year (around 07/14/2022).  I have spent a total of 65 minutes dedicated to this patient today, preparing to see patient, performing a medically appropriate examination and evaluation, ordering tests and/or medications and procedures, and counseling and educating the patient/family/caregiver; independently interpreting result and communicating results to the family/patient/caregiver; and documenting clinical information in the electronic medical record.    Windell Norfolk, MD 07/13/2021, 8:15 PM  [facility] [address], [address] [place], [place] [address] [phone]

## 2021-07-13 NOTE — Patient Instructions (Signed)
Continue current medication Dementia lab today Referral to neuropsychiatry Follow-up in 1 year

## 2021-07-14 LAB — VITAMIN B12: Vitamin B-12: >2000 pg/mL — ABNORMAL HIGH (ref 232–1245)

## 2021-07-14 LAB — TSH: TSH: 1.21 u[IU]/mL (ref 0.450–4.500)

## 2021-07-18 ENCOUNTER — Telehealth: Payer: Self-pay | Admitting: Neurology

## 2021-07-18 NOTE — Telephone Encounter (Signed)
Referral for Neuropsychology sent to [facility] [phone]. 

## 2021-07-20 DIAGNOSIS — F330 Major depressive disorder, recurrent, mild: Secondary | ICD-10-CM | POA: Diagnosis not present

## 2021-07-20 DIAGNOSIS — F900 Attention-deficit hyperactivity disorder, predominantly inattentive type: Secondary | ICD-10-CM | POA: Diagnosis not present

## 2021-07-20 DIAGNOSIS — F419 Anxiety disorder, unspecified: Secondary | ICD-10-CM | POA: Diagnosis not present

## 2021-08-03 DIAGNOSIS — F330 Major depressive disorder, recurrent, mild: Secondary | ICD-10-CM | POA: Diagnosis not present

## 2021-08-03 DIAGNOSIS — F900 Attention-deficit hyperactivity disorder, predominantly inattentive type: Secondary | ICD-10-CM | POA: Diagnosis not present

## 2021-08-23 DIAGNOSIS — Z79899 Other long term (current) drug therapy: Secondary | ICD-10-CM | POA: Diagnosis not present

## 2021-08-31 DIAGNOSIS — F900 Attention-deficit hyperactivity disorder, predominantly inattentive type: Secondary | ICD-10-CM | POA: Diagnosis not present

## 2021-08-31 DIAGNOSIS — F330 Major depressive disorder, recurrent, mild: Secondary | ICD-10-CM | POA: Diagnosis not present

## 2021-08-31 DIAGNOSIS — F419 Anxiety disorder, unspecified: Secondary | ICD-10-CM | POA: Diagnosis not present

## 2021-09-01 DIAGNOSIS — F330 Major depressive disorder, recurrent, mild: Secondary | ICD-10-CM | POA: Diagnosis not present

## 2021-09-01 DIAGNOSIS — F900 Attention-deficit hyperactivity disorder, predominantly inattentive type: Secondary | ICD-10-CM | POA: Diagnosis not present

## 2021-09-04 DIAGNOSIS — D123 Benign neoplasm of transverse colon: Secondary | ICD-10-CM | POA: Diagnosis not present

## 2021-09-04 DIAGNOSIS — D124 Benign neoplasm of descending colon: Secondary | ICD-10-CM | POA: Diagnosis not present

## 2021-09-04 DIAGNOSIS — D122 Benign neoplasm of ascending colon: Secondary | ICD-10-CM | POA: Diagnosis not present

## 2021-09-04 DIAGNOSIS — Z1211 Encounter for screening for malignant neoplasm of colon: Secondary | ICD-10-CM | POA: Diagnosis not present

## 2021-09-04 DIAGNOSIS — H40059 Ocular hypertension, unspecified eye: Secondary | ICD-10-CM | POA: Diagnosis not present

## 2021-10-02 DIAGNOSIS — F900 Attention-deficit hyperactivity disorder, predominantly inattentive type: Secondary | ICD-10-CM | POA: Diagnosis not present

## 2021-10-02 DIAGNOSIS — F330 Major depressive disorder, recurrent, mild: Secondary | ICD-10-CM | POA: Diagnosis not present

## 2021-10-04 DIAGNOSIS — F330 Major depressive disorder, recurrent, mild: Secondary | ICD-10-CM | POA: Diagnosis not present

## 2021-10-04 DIAGNOSIS — F419 Anxiety disorder, unspecified: Secondary | ICD-10-CM | POA: Diagnosis not present

## 2021-10-04 DIAGNOSIS — F900 Attention-deficit hyperactivity disorder, predominantly inattentive type: Secondary | ICD-10-CM | POA: Diagnosis not present

## 2021-11-01 DIAGNOSIS — F900 Attention-deficit hyperactivity disorder, predominantly inattentive type: Secondary | ICD-10-CM | POA: Diagnosis not present

## 2021-11-01 DIAGNOSIS — F330 Major depressive disorder, recurrent, mild: Secondary | ICD-10-CM | POA: Diagnosis not present

## 2021-11-01 DIAGNOSIS — F419 Anxiety disorder, unspecified: Secondary | ICD-10-CM | POA: Diagnosis not present

## 2021-11-02 DIAGNOSIS — F900 Attention-deficit hyperactivity disorder, predominantly inattentive type: Secondary | ICD-10-CM | POA: Diagnosis not present

## 2021-11-02 DIAGNOSIS — F330 Major depressive disorder, recurrent, mild: Secondary | ICD-10-CM | POA: Diagnosis not present

## 2021-12-06 DIAGNOSIS — F330 Major depressive disorder, recurrent, mild: Secondary | ICD-10-CM | POA: Diagnosis not present

## 2021-12-06 DIAGNOSIS — F419 Anxiety disorder, unspecified: Secondary | ICD-10-CM | POA: Diagnosis not present

## 2021-12-06 DIAGNOSIS — F900 Attention-deficit hyperactivity disorder, predominantly inattentive type: Secondary | ICD-10-CM | POA: Diagnosis not present

## 2022-01-01 DIAGNOSIS — Z79899 Other long term (current) drug therapy: Secondary | ICD-10-CM | POA: Diagnosis not present

## 2022-01-01 DIAGNOSIS — F330 Major depressive disorder, recurrent, mild: Secondary | ICD-10-CM | POA: Diagnosis not present

## 2022-01-01 DIAGNOSIS — F900 Attention-deficit hyperactivity disorder, predominantly inattentive type: Secondary | ICD-10-CM | POA: Diagnosis not present

## 2022-01-29 DIAGNOSIS — D2261 Melanocytic nevi of right upper limb, including shoulder: Secondary | ICD-10-CM | POA: Diagnosis not present

## 2022-01-29 DIAGNOSIS — L570 Actinic keratosis: Secondary | ICD-10-CM | POA: Diagnosis not present

## 2022-01-29 DIAGNOSIS — L821 Other seborrheic keratosis: Secondary | ICD-10-CM | POA: Diagnosis not present

## 2022-01-29 DIAGNOSIS — D2372 Other benign neoplasm of skin of left lower limb, including hip: Secondary | ICD-10-CM | POA: Diagnosis not present

## 2022-01-31 DIAGNOSIS — F3341 Major depressive disorder, recurrent, in partial remission: Secondary | ICD-10-CM | POA: Diagnosis not present

## 2022-01-31 DIAGNOSIS — F900 Attention-deficit hyperactivity disorder, predominantly inattentive type: Secondary | ICD-10-CM | POA: Diagnosis not present

## 2022-03-06 DIAGNOSIS — F900 Attention-deficit hyperactivity disorder, predominantly inattentive type: Secondary | ICD-10-CM | POA: Diagnosis not present

## 2022-03-06 DIAGNOSIS — F330 Major depressive disorder, recurrent, mild: Secondary | ICD-10-CM | POA: Diagnosis not present

## 2022-03-14 DIAGNOSIS — F330 Major depressive disorder, recurrent, mild: Secondary | ICD-10-CM | POA: Diagnosis not present

## 2022-03-14 DIAGNOSIS — F419 Anxiety disorder, unspecified: Secondary | ICD-10-CM | POA: Diagnosis not present

## 2022-03-14 DIAGNOSIS — F900 Attention-deficit hyperactivity disorder, predominantly inattentive type: Secondary | ICD-10-CM | POA: Diagnosis not present

## 2022-04-05 DIAGNOSIS — F330 Major depressive disorder, recurrent, mild: Secondary | ICD-10-CM | POA: Diagnosis not present

## 2022-04-05 DIAGNOSIS — F900 Attention-deficit hyperactivity disorder, predominantly inattentive type: Secondary | ICD-10-CM | POA: Diagnosis not present

## 2022-05-07 DIAGNOSIS — F900 Attention-deficit hyperactivity disorder, predominantly inattentive type: Secondary | ICD-10-CM | POA: Diagnosis not present

## 2022-05-07 DIAGNOSIS — F3341 Major depressive disorder, recurrent, in partial remission: Secondary | ICD-10-CM | POA: Diagnosis not present

## 2022-05-07 DIAGNOSIS — G2401 Drug induced subacute dyskinesia: Secondary | ICD-10-CM | POA: Diagnosis not present

## 2022-05-07 DIAGNOSIS — F419 Anxiety disorder, unspecified: Secondary | ICD-10-CM | POA: Diagnosis not present

## 2022-07-18 ENCOUNTER — Encounter: Payer: Self-pay | Admitting: Neurology

## 2022-07-18 ENCOUNTER — Ambulatory Visit: Payer: BC Managed Care – PPO | Admitting: Neurology

## 2022-07-18 ENCOUNTER — Telehealth: Payer: Self-pay | Admitting: Neurology

## 2022-07-18 VITALS — BP 125/87 | HR 103 | Ht 68.0 in | Wt 238.0 lb

## 2022-07-18 DIAGNOSIS — R413 Other amnesia: Secondary | ICD-10-CM | POA: Diagnosis not present

## 2022-07-18 NOTE — Progress Notes (Signed)
[facility]  PATIENT: Isabel Wood DOB: 05/07/1960  REQUESTING CLINICIAN: No ref. provider found HISTORY FROM: Patient  REASON FOR VISIT: Memory decline    HISTORICAL  CHIEF COMPLAINT:  Chief Complaint  Patient presents with   Follow-up    Rm 14. Patient alone, patient reports humming uncontrollably and not sure of the cause, short term memory still problematic according to pt.    INTERVAL HISTORY 07/18/2022 Short temr memory is bed  Started humming     HISTORY OF PRESENT ILLNESS:  This is a 62 year old woman past medical history of anxiety and ADHD who is presenting with memory concern for the past year and a half 2 years ago.  She has been following up with psychiatry in regard of anxiety and ADHD.  She mention lately she has trouble with detail orientation which started to impact her job.  She said her job is very detail oriented, she worked for the [employer].  She has trouble with her meeting at the end of the day, she has decrease in productivity, she said during her meeting she sometimes has inability to answer questions which is again causing her high stress.  She mentioned that coworker had noted some mistake in her inability to follow along in meetings. She also reports a sense of being forgetful and repeating herself. There is also report of inability to focus to get things done around the house.  She reports her short-term memory is for the most part okay, she is still independent, lives by herself, independent in all activities of daily living, still drives, denies being lost in familiar places.   TBI:   No past history of TBI Stroke:   no past history of stroke Seizures:   no past history of seizures Sleep:   no history of sleep apnea.  Mood:  patient denies anxiety and depression  Functional status: independent in all ADLs and IADLs Patient lives alone.   Cooking: Patient  Cleaning: Patient  Shopping: Patient  Bathing:  Patient Toileting: Patient  Driving: Patient, denies being loss in familiar place, no recent accidents Bills: Patient  Medications: Antidepressant and Amphetamine/Dextroamphetamine  Ever left the stove on by accident?: Forget to take food out of the [employer] how to use items around the house?: no Getting lost going to familiar places?: no Forgetting loved ones names?:  Struggle with names  Word finding difficulty? Yes  Sleep: Good    OTHER MEDICAL CONDITIONS: Anxiety and ADHD    REVIEW OF SYSTEMS: Full 14 system review of systems performed and negative with exception of: as noted in the HPI   ALLERGIES: No Known Allergies  HOME MEDICATIONS: Outpatient Medications Prior to Visit  Medication Sig Dispense Refill   amphetamine-dextroamphetamine (ADDERALL XR) 20 MG 24 hr capsule Take 20 mg by mouth 2 (two) times daily.     buPROPion (WELLBUTRIN XL) 300 MG 24 hr tablet Take 300 mg by mouth daily.     amphetamine-dextroamphetamine (ADDERALL XR) 10 MG 24 hr capsule Take 10 mg by mouth every morning.     ARIPiprazole (ABILIFY) 5 MG tablet Take 5 mg by mouth daily.     No facility-administered medications prior to visit.    PAST MEDICAL HISTORY: No past medical history on file.  PAST SURGICAL HISTORY: No past surgical history on file.  FAMILY HISTORY: No family history on file.  SOCIAL HISTORY: Social History   Socioeconomic History   Marital status: Single    Spouse name: Not on file  Number of children: Not on file   Years of education: Not on file   Highest education level: Not on file  Occupational History   Not on file  Tobacco Use   Smoking status: Never   Smokeless tobacco: Never  Substance and Sexual Activity   Alcohol use: Not on file   Drug use: Not on file   Sexual activity: Not on file  Other Topics Concern   Not on file  Social History Narrative   Not on file   Social Determinants of Health   Financial Resource Strain: Not on file  Food  Insecurity: Not on file  Transportation Needs: Not on file  Physical Activity: Not on file  Stress: Not on file  Social Connections: Not on file  Intimate Partner Violence: Not on file    PHYSICAL EXAM  GENERAL EXAM/CONSTITUTIONAL: Vitals:  Vitals:   07/18/22 1458  BP: 125/87  Pulse: (!) 103  Weight: 238 lb (108 kg)  Height: 5\' 8"  (1.727 m)   Body mass index is 36.19 kg/m. Wt Readings from Last 3 Encounters:  07/18/22 238 lb (108 kg)  07/13/21 230 lb 8 oz (104.6 kg)   Patient is in no distress; well developed, nourished and groomed; neck is supple  EYES: Pupils round and reactive to light, Visual fields full to confrontation, Extraocular movements intacts,   MUSCULOSKELETAL: Gait, strength, tone, movements noted in Neurologic exam below  NEUROLOGIC: MENTAL STATUS:      No data to display            07/18/2022    2:59 PM 07/13/2021    2:12 PM  Montreal Cognitive Assessment   Visuospatial/ Executive (0/5) 5 5  Naming (0/3) 3 3  Attention: Read list of digits (0/2) 2 2  Attention: Read list of letters (0/1) 1 1  Attention: Serial 7 subtraction starting at 100 (0/3) 3 3  Language: Repeat phrase (0/2) 2 2  Language : Fluency (0/1) 1 1  Abstraction (0/2) 2 2  Delayed Recall (0/5) 2 2  Orientation (0/6) 6 6  Total 27 27  Adjusted Score (based on education) 27 27     CRANIAL NERVE:  2nd, 3rd, 4th, 6th - pupils equal and reactive to light, visual fields full to confrontation, extraocular muscles intact, no nystagmus 5th - facial sensation symmetric 7th - facial strength symmetric 8th - hearing intact 9th - palate elevates symmetrically, uvula midline 11th - shoulder shrug symmetric 12th - tongue protrusion midline  MOTOR:  normal bulk and tone, full strength in the BUE, BLE  SENSORY:  normal and symmetric to light touch, vibration  COORDINATION:  finger-nose-finger, fine finger movements normal  REFLEXES:  deep tendon reflexes present and  symmetric  GAIT/STATION:  normal   DIAGNOSTIC DATA (LABS, IMAGING, TESTING) - I reviewed patient records, labs, notes, testing and imaging myself where available.  No results found for: "WBC", "HGB", "HCT", "MCV", "PLT" No results found for: "NA", "K", "CL", "CO2", "GLUCOSE", "BUN", "CREATININE", "CALCIUM", "PROT", "ALBUMIN", "AST", "ALT", "ALKPHOS", "BILITOT", "GFRNONAA", "GFRAA" No results found for: "CHOL", "HDL", "LDLCALC", "LDLDIRECT", "TRIG", "CHOLHDL" No results found for: "HGBA1C" Lab Results  Component Value Date   VITAMINB12 >2000 (H) 07/13/2021   Lab Results  Component Value Date   TSH 1.210 07/13/2021      ASSESSMENT AND PLAN  62 y.o. year old female with anxiety/depression, ADD who is presenting with memory decline for the past year and a half to years.  She reported memory decline as trouble with  concentration, difficulty in a job which she is highly detail oriented.  She does report decrease in productivity.  On exam today she scored 27 out of 30 on the Moca which is normal.  Patient does have a history of anxiety/depression and ADD for which she takes amphetamine/dextroamphetamine, total of 50 mg daily.  I informed patient that at this point I am not sure if her complaints are related to her ADD or it is mild cognitive impairment.  She denies any family history of dementia.  At this point, I will refer her to neuropsych for formal testing, hopefully this will help [place] confirm the diagnosis.  I will see her in 1 year for follow-up    No diagnosis found.    There are no Patient Instructions on file for this visit.  No orders of the defined types were placed in this encounter.   No orders of the defined types were placed in this encounter.   No follow-ups on file.   Windell Norfolk, MD 07/18/2022, 3:06 PM  [facility] [address] [place], [place] [address] [phone]

## 2022-07-18 NOTE — Telephone Encounter (Signed)
Isabel Wood: [id number] exp. 07/18/22-08/16/22 sent to [facility] [phone] [employer] chose this facility

## 2022-07-19 NOTE — Patient Instructions (Addendum)
Continue current medications MRI brain without contrast Referral for formal neuropsychological testing Follow-up in 1 year or sooner if worse.   There are well-accepted and sensible ways to reduce risk for Alzheimers disease and other degenerative brain disorders .  Exercise Daily Walk A daily 20 minute walk should be part of your routine. Disease related apathy can be a significant roadblock to exercise and the only way to overcome this is to make it a daily routine and perhaps have a reward at the end (something your loved one loves to eat or drink perhaps) or a personal trainer coming to the home can also be very useful. Most importantly, the patient is much more likely to exercise if the caregiver / spouse does it with him/her. In general a structured, repetitive schedule is best.  General Health: Any diseases which effect your body will effect your brain such as a pneumonia, urinary infection, blood clot, heart attack or stroke. Keep contact with your primary care doctor for regular follow ups.  Sleep. A good nights sleep is healthy for the brain. Seven hours is recommended. If you have insomnia or poor sleep habits we can give you some instructions. If you have sleep apnea wear your mask.  Diet: Eating a heart healthy diet is also a good idea; fish and poultry instead of red meat, nuts (mostly non-peanuts), vegetables, fruits, olive oil or canola oil (instead of butter), minimal salt (use other spices to flavor foods), whole grain rice, bread, cereal and pasta and wine in moderation.Research is now showing that the MIND diet, which is a combination of The Mediterranean diet and the DASH diet, is beneficial for cognitive processing and longevity. Information about this diet can be found in The MIND Diet, a book by Alonna Minium, MS, RDN, and online at [employer]  Finances, Power of [address] and Advance Directives: You should consider putting legal safeguards in  place with regard to financial and medical decision making. While the spouse always has power of attorney for medical and financial issues in the absence of any form, you should consider what you want in case the spouse / caregiver is no longer around or capable of making decisions.

## 2022-07-24 ENCOUNTER — Telehealth: Payer: Self-pay | Admitting: Neurology

## 2022-07-24 NOTE — Telephone Encounter (Signed)
Referral sent to [facility]: Phone: [phone]   Fax: [phone]

## 2022-07-31 ENCOUNTER — Ambulatory Visit
Admission: RE | Admit: 2022-07-31 | Discharge: 2022-07-31 | Disposition: A | Discharge status: Home / Self Care | Payer: BC Managed Care – PPO | Source: Ambulatory Visit | Attending: Neurology | Admitting: Neurology

## 2022-07-31 DIAGNOSIS — R413 Other amnesia: Secondary | ICD-10-CM | POA: Diagnosis not present

## 2022-08-01 ENCOUNTER — Encounter: Payer: Self-pay | Admitting: Psychology

## 2022-08-05 ENCOUNTER — Other Ambulatory Visit: Payer: BC Managed Care – PPO

## 2022-09-14 DIAGNOSIS — F419 Anxiety disorder, unspecified: Secondary | ICD-10-CM | POA: Diagnosis not present

## 2022-09-14 DIAGNOSIS — F330 Major depressive disorder, recurrent, mild: Secondary | ICD-10-CM | POA: Diagnosis not present

## 2022-09-14 DIAGNOSIS — Z5181 Encounter for therapeutic drug level monitoring: Secondary | ICD-10-CM | POA: Diagnosis not present

## 2022-09-14 DIAGNOSIS — G2401 Drug induced subacute dyskinesia: Secondary | ICD-10-CM | POA: Diagnosis not present

## 2022-09-14 DIAGNOSIS — F3341 Major depressive disorder, recurrent, in partial remission: Secondary | ICD-10-CM | POA: Diagnosis not present

## 2022-10-18 DIAGNOSIS — F330 Major depressive disorder, recurrent, mild: Secondary | ICD-10-CM | POA: Diagnosis not present

## 2022-10-18 DIAGNOSIS — F419 Anxiety disorder, unspecified: Secondary | ICD-10-CM | POA: Diagnosis not present

## 2022-10-18 DIAGNOSIS — F900 Attention-deficit hyperactivity disorder, predominantly inattentive type: Secondary | ICD-10-CM | POA: Diagnosis not present

## 2022-11-19 DIAGNOSIS — Z6834 Body mass index (BMI) 34.0-34.9, adult: Secondary | ICD-10-CM | POA: Diagnosis not present

## 2022-11-19 DIAGNOSIS — Z0000 Encounter for general adult medical examination without abnormal findings: Secondary | ICD-10-CM | POA: Diagnosis not present

## 2022-11-19 DIAGNOSIS — Z13220 Encounter for screening for lipoid disorders: Secondary | ICD-10-CM | POA: Diagnosis not present

## 2022-11-19 DIAGNOSIS — Z01419 Encounter for gynecological examination (general) (routine) without abnormal findings: Secondary | ICD-10-CM | POA: Diagnosis not present

## 2022-11-19 DIAGNOSIS — Z1331 Encounter for screening for depression: Secondary | ICD-10-CM | POA: Diagnosis not present

## 2022-11-29 DIAGNOSIS — F419 Anxiety disorder, unspecified: Secondary | ICD-10-CM | POA: Diagnosis not present

## 2022-11-29 DIAGNOSIS — F330 Major depressive disorder, recurrent, mild: Secondary | ICD-10-CM | POA: Diagnosis not present

## 2022-11-29 DIAGNOSIS — F900 Attention-deficit hyperactivity disorder, predominantly inattentive type: Secondary | ICD-10-CM | POA: Diagnosis not present

## 2023-01-28 DIAGNOSIS — F900 Attention-deficit hyperactivity disorder, predominantly inattentive type: Secondary | ICD-10-CM | POA: Diagnosis not present

## 2023-01-28 DIAGNOSIS — F419 Anxiety disorder, unspecified: Secondary | ICD-10-CM | POA: Diagnosis not present

## 2023-01-28 DIAGNOSIS — F330 Major depressive disorder, recurrent, mild: Secondary | ICD-10-CM | POA: Diagnosis not present

## 2023-04-17 ENCOUNTER — Encounter: Payer: BC Managed Care – PPO | Attending: Psychology | Admitting: Psychology

## 2023-04-17 ENCOUNTER — Encounter: Payer: Self-pay | Admitting: Psychology

## 2023-04-17 DIAGNOSIS — R4189 Other symptoms and signs involving cognitive functions and awareness: Secondary | ICD-10-CM | POA: Diagnosis present

## 2023-04-17 NOTE — Progress Notes (Signed)
 NEUROPSYCHOLOGICAL EVALUATION [place]. [facility]  Physical Medicine and Rehabilitation     Patient: Isabel Wood  MRN: [id number] DOB: 02-06-1961  Age: 63 y.o. Sex: female  Race/Ethnicity: White or Caucasian  Years of Education: 14  Referring Provider: Windell Norfolk, MD  Provider/Clinical Neuropsychologist: Thelma Comp, PsyD  Date of Service: 04/17/23 Start Time: 8am End Time: 10am  Location of Service:  [facility] Physical Medicine & Rehabilitation Department [place]. [facility] [address] [place], [place] [address] Phone: [phone]  Billing Code/Service:            96116/96121  PATIENT CONSENT AND CONFIDENTIALITY  The patient's understanding of the reason for referral was intact. Limits of confidentiality were reviewed. Discussed the posting of final evaluation report in the EMR for the patient and for appropriate medical professionals to review when necessary. The neuropsychological evaluation process was discussed. The patient consented to proceed with the evaluation.  Consent for Evaluation and Treatment: Signed: Yes Explanation of Privacy Policies: Signed: Yes Discussion of Confidentiality Limits: Yes  REASON FOR REFERRAL  The patient was referred for neuropsychological evaluation by her neurologist, Dr. Gennaro Wood, due to concerns for memory decline. History from medical records from the referring provider, dated 07/18/2022, indicate the patient reported memory difficulties which began over the last several years. Notes indicate she is intact with basic and instrumental activities of daily living but has had difficulties at work leading to increased stress. Difficulties with focus and task completion at home were reported as well. Records state that the patient has a past medical history of anxiety and ADHD. The patient completed cognitive screening measures (MoCA) on 07/13/2021 and 07/18/2022 and scored within  normal limits on both (27 out of 30). Given concerns for progressive memory decline, the patient was referred for neuropsychological evaluation and an MRI of the brain. Results from the MRI, per records, were reported to be unremarkable.   During the clinical interview for the current evaluation, the patient expressed hopes that the evaluation could provide some diagnostic and etiological clarity for her cognitive symptoms, and ADHD in particular. She expressed uncertainty herself as to whether the diagnosis was or was not accurate.   HISTORY & PRESENTING CONCERNS:  Cognitive Symptom Onset & Course: Upon interview, the patient reported a rapid, but not sudden, onset of cognitive symptoms approximately three years ago. She stated that symptoms have been "pretty steady" overall, without a clear progressive decline. She indicated there was some shorter-time variability in symptom, with exacerbation of symptoms due to stress. She denied any cognitive difficulties prior to three years ago. 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 Work related stress increased and remained elevated since that time.   Current Cognitive Complaints:  Memory: The patient endorsed mild difficulties with short term memory, but also reported that she had historically had a strong memory. She denied significant difficulties remembering recent conversations or recent notable events in her life. She noted increased difficulty remembering exchanges which via text (I.e., [employer] teams) versus face to face. She reported misplacing things occasionally, but indicated this has been long-running. She utilizes reminders slightly more to remember future tasks. She denied problems remembering medications or getting lost in familiar places.    Processing Speed: She endorsed slowed processing speed relative to her baseline.     Attention & Concentration: She reported some mild  difficulties with sustaining attention that are premorbid (ie., "drift  a little" while reading a book). She indicated that she is more distractible at home and at work, impacting her speed in task completion even with regular routines. She is typically able to maintain her train of thought.    Language: Denied any significant expressive or receptive language difficulties excluding trouble with word-finding "a few times a week."  Visual-Spatial: She denied any problems with tasks involving visual-spatial functioning.    Executive Functioning: She endorsed premorbid weaknesses in organization which have worsened. She indicated she struggles with decision making when feeling overwhelmed. She denied any concerns with basic problem solving and planning. She reported no clear indications of disinhibition or other notable changes in her behavior.    Motor/Sensory Complaints: The patient denied any changes in her sense of smell or taste. She denied any changes in vision outside of those related to aging. She indicated uncertainty about changes in hearing, and is considering getting a hearing test. She denied experiencing any tremor or changes in balance. She acknowledged starting to have "more of a shuffling gait" as of about a year ago and has had three falls over the last year due to tripping on obstacles. She denied any unaccounted for feelings of lightheadedness.   Emotional and Behavioral Functioning: The patient denied any psychiatric history prior to the last few years. With respect to mood, she denied marked feelings of sadness but feels more "blue" and some anhedonia that is more noticeable when inactive. Changes in mood began around six to nine months ago. She reported significant struggles with work related stress and elevated levels of anxiety that began within the last several years. Symptoms are variable in frequency/intensity but unremitting overall. She endorsed primarily physiological symptoms of  anxiety including restlessness, tension, fatigue, and difficulty relaxing. She reported delayed sleep onset due to trouble relaxing and "turning off" her mind at night. She reported experiencing panic attacks which started approximately three years ago and currently occur once every one to two weeks. She described feeling "overwhelmed" and less able to cope with stress. She denied any current or past suicidal ideation, homicidal ideation, psychosis, mania, symptoms of PTSD, and has never had a psychiatric hospitalization. 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 She described current stress levels as an 8 out of 10.   The patient is currently under the care of psychiatric and is treated with medication. She reported initial benefit in Bupropion and Desvenlafaxine with mood when started around three years ago. Her medication dose has been stable for the last two years. She began her prescription of Adderall for her cognitive symptoms around three years ago. She reported initial improvement in attention and concentration but declines in symptom control over time despite dose increases. She recently began a Clonidine to aid with sleep onset. She has a history of individual therapy (2022-2023) and found it helpful for processing through losses she experienced. She discontinued eventually as she felt she "ran out of stuff to talk about." She reported utilizing controlled breathing and going for a walk to help manage panic when it occurs.   Sleep: The patient reported declines in her sleep over the last eight weeks in that she has struggled with sleep onset and typically gets four and five hours per night as a result. She indicated she needs around six hours to feel rested. She reported tha this is a notable change for her as  she had previously had no issue falling asleep.  She denied any history sleep apnea or Rbs.   Appetite: Decreased appetite and unintentional loss of 10 pounds over the last four months.  Caffeine: None currently. Alcohol Use: One to two glasses of wine 3x-4x per week.   Tobacco Use: None. Recreational Substance Use: None.   Level of Functional Independence: The patient is intact with basic and instrumental activities of daily living. She maintains full time employment and has not received any negative performance reviews.   Medical History/Record Review: The patient denied any history of cancer, cardiovascular disease, seizure, chronic pain, TBI, or other likely contributory medical conditions.   History reviewed. No pertinent past medical history.  Patient Active Problem List   Diagnosis Date Noted   Pain in right foot 06/27/2020   Rib pain 06/27/2020   Spinal stenosis of lumbar region 01/02/2019   Pain of left hip joint 07/01/2018   Fatty liver 10/02/2016   Elevated LFTs 08/21/2016   Fatigue 08/21/2016   Non-intractable vomiting with nausea 08/21/2016   Periumbilical abdominal pain 08/21/2016   Ocular hypertension 03/10/2013   Wound of sacral region 07/29/2012   Unspecified reason for consultation 06/10/2012    Imaging/Lab Results:  "NEUROIMAGING REPORT STUDY DATE: 07/31/22 ... EXAM: MR BRAIN WO CONTRAST  ... FINDINGS:    No abnormal lesions are seen on diffusion-weighted views to suggest acute ischemia. The cortical sulci, fissures and cisterns are normal in size and appearance. Lateral, third and fourth ventricle are normal in size and appearance. No extra-axial fluid collections are seen. No evidence of mass effect or midline shift.     On sagittal views the posterior fossa, pituitary gland and corpus callosum are unremarkable. No evidence of intracranial hemorrhage on SWI views. The orbits and their contents, paranasal sinuses and calvarium are mucus thickening in the right maxillary sinus, right globe lens extraction  and possible scleral banding. Intracranial flow voids are present.  IMPRESSION:  Unremarkable MRI brain without contrast. No acute findings."  Family Neurologic/Medical Hx: The patient reported suspected dementia due to Alzheimer's disease in her mother (onset mid 2s) and maternal aunt (onset late 68s). Her maternal grandmother was suspected to have had dementia (onset late 31s).   Medications:  Clonidine 0.5 mg at night Adderall 20 mg 3x daily Bupropion 300 mg daily Desvenlafaxine 100 mg daily  Educational/Vocational History: The patient completed high school and two years of college (English major). 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 She described a cognitively demanding task requiring shifting of attention and frequent problem solving / decision making. She also described a level of unpredictability at work that necessitates flexibility and adaptability.    Psychosocial: Marital Status: One prior marriage of five years (in 32s).  Children/Grandchildren: Daughter Living Situation: Lives with her dog and several cats. Socially, she indicated regular interactions with several neighbors.  Daily Activities/Hobbies: She described enjoying taking her dog for walks, watching birds, being in nature. She indicated she used to enjoy growing plans but does so less frequently now.   Behavioral Observations: The patient was seen on an outpatient basis in the [facility] PM&R office for the clinical interview. The patient was unaccompanied.  Orientation: Intact to person, place, time, and situation.  Ability to Participate in Interview: The patient readily answered  all questions without issue and with adequate detail.  Attitude/Interactions: Cooperative. Appropriate  for the setting / purpose. Attention: No clear attentional lapses were noted, behaviorally.  Conversational Language: Spontaneous speech was prosodic, fluent, and well-articulated. She did not demonstrate word-finding problems or paraphasic errors in conversation. Receptive language skills appeared intact.  Thought Process/Content: No indications of disordered thought were appreciated. Responses to questions were well formulated and pertinent to the question asked.  Motor Problems: The patient ambulated independently and at a typical pace. Not appreciable tremor was noted during the interview.  Affect/Mood: Mood was largely euthymic. Affect was largely congruent with mood. Indications of mild tension / anxiousness were seen earlier in the visit.  Behavior & Insight: Patient behavioral was unremarkable. Insight was good.    SUMMARY / CLINICAL IMPRESSIONS  The patient is a 63 year old woman presenting for neuropsychological evaluation due to concerns for memory decline and attentional deficits. Symptom onset began around three years ago and course has been stable overall but with some variability over shorter time-spans. Cognitive symptoms are primarily impacting attention and concentration. She is intact with basic and instrumental activities of daily living and is employed full time. She describes decreased efficiency with tasks at home and at work because of her symptoms. She denied any premorbid cognitive difficulties. Onset of psychiatric symptoms involving anxiety with panic attacks and sub-clinical symptoms of depression also began around three years ago. Onset of cognitive and and most psychiatric symptoms appear to have coincided with significant changes in her work environment. Work stress has remained significant since those changes. She is currently treated with medication for anxiety, mood, and attention deficits. She reported initial improvements anxiety and concentration when starting the  medication. Cognitive symptom reduction was not maintained, even upon dose increases. The patient's medical history is largely unremarkable, although there is a family history of dementia. A MRI of the brain was reportedly unremarkable. The patient endorsed declines in sleep quality, but only recently. The patient indicated she hoped the evaluation could provide clarity regarding likely etiology underlying her symptoms and for treatment planning.     DISPOSITION / PLAN  The patient has been set up for a formal neuropsychological assessment to objectively assess her cognitive functioning across domains to establish the patient's cognitive profile. This data, in conjunction with information obtained via clinical interview and medical record review, will help clarify likely etiology and guide treatment recommendations. Once data collection and interpretation have been completed, the findings / diagnosis and recommendations will be reviewed and discussed with the patient during a feedback appointment with the neuropsychologist. Based on the collaborative dialogue with the patient during the feedback, recommendations may be adjusted / tailored as needed. A formal report will be produced and provided to the patient and the referring provider.   Visit Diagnosis:  Cognitive Changes Unspecified Anxiety Disorder    This report was generated using voice recognition software. While this document has been carefully reviewed, transcription errors may be present. I apologize in advance for any inconvenience. Please contact me if further clarification is needed.             Isabel Comp, PsyD             Neuropsychologist

## 2023-04-24 ENCOUNTER — Encounter: Payer: BC Managed Care – PPO | Attending: Psychology

## 2023-04-24 DIAGNOSIS — R4189 Other symptoms and signs involving cognitive functions and awareness: Secondary | ICD-10-CM | POA: Insufficient documentation

## 2023-04-25 ENCOUNTER — Encounter: Payer: BC Managed Care – PPO | Admitting: Psychology

## 2023-04-25 ENCOUNTER — Encounter: Payer: Self-pay | Admitting: Psychology

## 2023-04-25 DIAGNOSIS — R4189 Other symptoms and signs involving cognitive functions and awareness: Secondary | ICD-10-CM | POA: Diagnosis not present

## 2023-04-25 NOTE — Progress Notes (Incomplete)
 NEUROPSYCHOLOGICAL EVALUATION [place]  Physical Medicine and Rehabilitation    Patient: Isabel Wood  MRN: [id number] DOB: [birth year]  Age: 63 y.o. Sex: female  Race/Ethnicity: White or Caucasian  Years of Education: 14  Referring Provider: Windell Norfolk, MD   Provider/Clinical Neuropsychologist: Thelma Comp, PsyD  Date of Service: 04/25/23 Start Time: 10:00 AM End Time: 11:00 AM  Location of Service:  [facility] Physical Medicine & Rehabilitation Department [place] [address] [place], [place] [address] Phone: [phone]  Billing Code/Service:           [phone]  Individuals Present: Thelma Comp, PsyD. 1 hour was spent on interpretation of patient data, interpretation of standardized test results and clinical data, clinical decision making, initial treatment planning/recommendations, and report writing. The report will be amended as needed based on any additional information collected during interactive feedback session.  REASON FOR REFERRAL  The patient was referred for neuropsychological evaluation by her neurologist, Dr. Gennaro Africa, due to concerns for memory decline. History from medical records from the referring provider, dated 07/18/2022, indicate the patient reported memory difficulties which began over the last several years. Notes indicate she is intact with basic and instrumental activities of daily living but has had difficulties at work leading to increased stress. Difficulties with focus and task completion at home were reported as well. Records state that the patient has a past medical history of anxiety and ADHD. The patient completed cognitive screening measures (MoCA) on 07/13/2021 and 07/18/2022 and scored within normal limits on both (27 out of 30). Given concerns for progressive memory decline, the patient was referred for neuropsychological evaluation and an MRI of the brain. Results  from the MRI, per records, were reported to be unremarkable. During the clinical interview for the current evaluation, the patient expressed hopes that the evaluation could provide some diagnostic and etiological clarity regarding her cognitive symptoms. HISTORY & PRESENTING CONCERNS:  Cognitive Symptom Onset & Course: Upon interview, the patient reported a rapid, but not sudden, onset of cognitive symptoms approximately three years ago. She stated that symptoms have been "pretty steady" overall, without a clear progressive decline. She indicated there was some shorter-time variability in symptom, with exacerbation of symptoms due to stress. 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 Work related stress increased and remained elevated since that time.   Current Cognitive Complaints:  Memory: The patient endorsed mild difficulties with short term memory, adding that she has historically had a strong memory. She denied significant difficulties remembering recent conversations or recent notable events. She noted increased difficulty remembering exchanges which via text (I.e., [employer] teams) versus face to face. She reported misplacing things occasionally, but indicated this has been long-running. She utilizes reminders slightly more to remember future tasks. She denied problems remembering medications or getting lost in familiar places.   Processing Speed: The patient reported slowed processing speed. Attention & Concentration: The patient endorsed mild difficulties with sustaining attention that are premorbid (ie., "drift a little" while reading a book).  She reported increased distractibility at home and at work, impacting her speed in task completion.  Language: She denied any significant expressive or receptive language difficulties excluding trouble with word-finding "a few times a week." Visual-Spatial: She denied any problems  with tasks involving visual-spatial functioning.   Executive Functioning: The patient endorsed premorbid weaknesses in organization which have worsened. She indicated  she struggles with decision making when feeling overwhelmed. She denied any concerns with basic problem solving and planning. She reported no clear indications of disinhibition or other notable changes in her behavior.   Motor/Sensory Complaints: The patient denied any changes in her sense of smell or taste. She denied any changes in vision outside of those related to aging. She indicated uncertainty about changes in hearing, and is considering getting a hearing test. She denied experiencing any tremor or changes in balance. She acknowledged starting to have "more of a shuffling gait" as of about a year ago and has had three falls over the last year due to tripping on obstacles. She denied any unaccounted for feelings of lightheadedness.    Emotional and Behavioral Functioning: The patient denied any psychiatric history prior to the last few years. With respect to mood, she denied marked feelings of sadness but feels more "blue" and some anhedonia that is more noticeable when inactive. Changes in mood began around six to nine months ago. She reported significant struggles with work related stress and elevated levels of anxiety that began within the last several years. Symptoms are variable in frequency/intensity but unremitting overall. She endorsed primarily physiological symptoms of anxiety including restlessness, tension, fatigue, and difficulty relaxing. She reported delayed sleep onset due to trouble relaxing and "turning off" her mind at night. She reported experiencing panic attacks which started approximately three years ago and currently occur once every one to two weeks. She described feeling "overwhelmed" and less able to cope with stress. She denied any current or past suicidal ideation, homicidal ideation, psychosis, mania, symptoms  of PTSD, and has never had a psychiatric hospitalization. 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 She described current stress levels as an 8 out of 10.    The patient is currently under the care of psychiatric and is treated with medication. She reported initial benefit in Bupropion and Desvenlafaxine with mood when started around three years ago. Her medication dose has been stable for the last two years. She began her prescription of Adderall for her cognitive symptoms around three years ago. She reported initial improvement in attention and concentration but declines in symptom control over time despite dose increases. She recently began a Clonidine to aid with sleep onset. She has a history of individual therapy (2022-2023) and found it helpful for processing through losses she experienced. She discontinued eventually as she felt she "ran out of stuff to talk about." She reported utilizing controlled breathing and going for a walk to help manage panic when it occurs.   Sleep: The patient reported declines in her sleep over the last eight weeks in that she has struggled with sleep onset and typically gets four and five hours per night as a result. She indicated she needs around six hours to feel rested. She reported tha this is a notable change for her as she had previously had no issue falling asleep. She denied any history sleep apnea or Rbs.   Appetite: Decreased appetite and unintentional loss of 10 pounds over the last four months.  Caffeine: None currently. Alcohol Use: One to two glasses of wine 3x-4x per week.     Tobacco Use: None. Recreational Substance Use: None.   Level of Functional Independence: The patient is intact with basic and instrumental activities of daily living. She maintains full time employment and has not received  any negative  performance reviews.    Medical History/Record Review: The patient denied any history of cancer, cardiovascular disease, seizure, chronic pain, TBI, or other likely contributory medical conditions.       Patient Active Problem List    Diagnosis Date Noted   Pain in right foot 06/27/2020   Rib pain 06/27/2020   Spinal stenosis of lumbar region 01/02/2019   Pain of left hip joint 07/01/2018   Fatty liver 10/02/2016   Elevated LFTs 08/21/2016   Fatigue 08/21/2016   Non-intractable vomiting with nausea 08/21/2016   Periumbilical abdominal pain 08/21/2016   Ocular hypertension 03/10/2013   Wound of sacral region 07/29/2012   Unspecified reason for consultation 06/10/2012      Imaging/Lab Results:  "NEUROIMAGING REPORT STUDY DATE: 07/31/22 ... EXAM: MR BRAIN WO CONTRAST  ... FINDINGS:    No abnormal lesions are seen on diffusion-weighted views to suggest acute ischemia. The cortical sulci, fissures and cisterns are normal in size and appearance. Lateral, third and fourth ventricle are normal in size and appearance. No extra-axial fluid collections are seen. No evidence of mass effect or midline shift.     On sagittal views the posterior fossa, pituitary gland and corpus callosum are unremarkable. No evidence of intracranial hemorrhage on SWI views. The orbits and their contents, paranasal sinuses and calvarium are mucus thickening in the right maxillary sinus, right globe lens extraction and possible scleral banding. Intracranial flow voids are present.   IMPRESSION:  Unremarkable MRI brain without contrast. No acute findings."   Family Neurologic/Medical Hx: The patient reported suspected dementia due to Alzheimer's disease in her mother (onset mid 84s) and maternal aunt (onset late 44s). Her maternal grandmother was suspected to have had dementia (onset late 46s).    Medications:  Clonidine 0.5 mg at night Adderall 20 mg 3x daily Bupropion 300 mg daily Desvenlafaxine 100 mg  daily   Educational/Vocational History: The patient completed high school and two years of college (English major). 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 She described a cognitively demanding task requiring shifting of attention and frequent problem solving / decision making. She also described a level of unpredictability at work that necessitates flexibility and adaptability.   Psychosocial: Marital Status: One prior marriage of five years (in 4s).  Children/Grandchildren: Daughter Living Situation: Lives with her dog and several cats. Socially, she indicated regular interactions with several neighbors.  Daily Activities/Hobbies: She described enjoying taking her dog for walks, watching birds, being in nature. She indicated she used to enjoy growing plants but does so less frequently now.   NEUROPSYCHODIAGNOSTIC FINDINGS  Tests Administered: Automatic Data Edition (BNT-2) Brief Visuospatial Memory Test-Revised (BVMT-R) Benton Judgment of Line Orientation (JOLO) DIRECTV Verbal Learning Test-Third Edition (CVLT-3) Clock Drawing Test Conners Continuous Performance Test 3rd Edition (CPT-3) Controlled Oral Word Association Test (FAS & Animals) Delis-Kaplan Executive Function System (D-KEFS), select subtests Grooved Pegboard Test Rey Complex Figure Test (RCFT), select subtests Trail Making Test (TMT; Part A & B) Wechsler Adult Intelligence Scale-Fourth Edition (WAIS-IV), select subtests Wechsler Memory Scale-Third Edition (WMS-III), select subtests  Wechsler Test of Adult Reading (WTAR) [employer] [phone]) The Beck Depression Inventory-II (BDI-II) Beck Anxiety Inventory (BAI)  Validity: Embedded performance validity metrics for  within normal limits and, when combined with behavioral observations,  support the interpretation of test data as a valid and reliable estimate of the patient's current cognitive functioning.  ESTIMATED PREMORBID COG.          Norm Score Percentile Range  Wechsler Test of Adult Reading     SS = 126 96 %ile Above Average  The patient's performance on a word reading/recognition task, designed to provide premorbid estimates of cognitive functioning, was scored in the above average range.  The patient's performance on this measure may be impacted by her academic history (English major), and potentially overestimate the patient's premorbid cognitive abilities.  Premorbid cognitive functioning more likely ranges from average to high average. ATTENTION AND WORKING MEMORY   Norm Score Percentile Range  WAIS-IV                    Digit Span     ss = 13 84 %ile High Average    DSF     ss = 14 91 %ile Above Average    Span:         8          DSB     ss = 13 84 %ile High Average    Span:         6          DSS     ss = 11 63 %ile Average    Span:         6        WMS-III                    Spatial Span     ss = 11 63 %ile Average    SSF     ss = 6 9 %ile Low Average    Span:         5          SSB     ss = 15 95 %ile Above Average    Span:         6        The patient's overall performance on a measure of auditory verbal attention was scored in the high average range.  She scored above average for basic span of auditory verbal attention.  On subtests with increased working memory/mental manipulation demands she scored high average and average.  The difference and scores between these 2 subtests was not statistically significant.  The patient's performance on a visual attention test was scored in the average range overall.  There was a significant score difference between her basic span performance (low average) and her performance on the subtest with greater working memory demands (above average).  Her  overall performance across these attention measure show and inconsistent overall profile.  CPT-3      Norm Score Percentile    Qualitative Descriptor     Response Style         Balanced    Detectability (reverse scored) t = 59 80 %ile Elevated    Omission Errors t = 55 83 %ile Slight Elevation    Commission Errors t = 53 68 %ile Average    Perseverations t = 52 77 %ile Average    HRT (reaction time) t = 68 98 %ile Slow    HRT SD (reaction time consistency) t = 56 78 %ile Slight Elevation    Variability (in RT consistency) t = 59 82 %ile Slight Elevation    HRT Block Change (over test) t =  65 94 %ile Elevated    HRT ISI Change (by stimuli interval) t = 65 93 %ile Elevated  The patient completed a continuous performance test which assesses multiple aspects of attention as well as impulsivity.  Embedded validity metrics were within normal limits.  Her response style was balanced for speed and accuracy.  Her score profile showed possible indications of problems with inattention and vigilance.  Her scores showed stronger support for difficulties with sustained attention.  Her profile did not show any indications of problems with impulsivity.  PROCESSING SPEED         Norm Score Percentile Range  WAIS-IV                    Coding     ss = 11 63 %ile Average    Symbol Search     ss = 15 95 %ile Above Average  The patient's performance on measures of processing speed ranged from average to above average.  Her performance in the average range was on a speeded digit symbol translation task.  Her above average range performance was on a speeded task prioritizing visual scanning with novel stimuli.   PSYCHOMOTION         Norm Score Percentile Range  Grooved Pegboard - Dominant     t = 38 12 %ile Low Average    # of drops         1        Grooved Pegboard - Nondominant   t = 47 37 %ile Average    # of drops         0        The patient's performance on a speeded motor dexterity task was low average for  her dominant hand and average for her nondominant hand.  The difference was not statistically significant.  LANGUAGE         Norm Score Percentile Range  Boston Naming Test (BNT-2)     t = 53 61 %ile Average  COWAT                    FAS     t = 47 37 %ile Average    Animals     t = 53 61 %ile Average  The patient's performance on measures of language functioning were normed by both age and education.  With respect to others around her age and education level she scored in the average range on a confrontation naming/word retrieval test, phonemic verbal fluency, and semantic verbal fluency.  EXECUTIVE FUNCTIONING         Norm Score Percentile Range  DKEFS - Color-Word Interference                  Color Naming     ss = 12 75 %ile High Average    Word Reading     ss = 11 63 %ile Average    Inhibition     ss = 8 25 %ile Average    Errors     ss = 6 9 %ile Low Average    Inhibition Switching     ss = 13 84 %ile High Average    Errors     ss = 11 63 %ile Average   Trails A     t = 49 45 %ile Average    Errors         0        Trails B  t = 46 32 %ile Average    Errors         1         DKEFS - Verbal Fluency                    Category Switching     ss = 5 5 %ile Below Average    Switching Accuracy     ss = 6 9 %ile Low Average   Wisconsin Card Sorting Test (WCST)              Total Correct                    WCST Total Errors     t = 44 27 %ile Average    WCST Perseverative Errors     t = 47 37 %ile Average    WCST Non-Perseverative Errors t = 39 13 %ile Low Average    WCST Conceptual Responses   t = 45 30 %ile Average    # Categories Completed           >16 %ile WNL     Trials to First           >16 %ile  WNL    FTMS           >16 %ile  WNL  The patient completed multiple tasks assessing various aspects of executive functioning.  Her rapid color naming and color word reading were high average and average respectively.  Her speed was average with basic response inhibition and low  average with total errors, by age.  However her speed was low average relative to her baseline (color naming-inhibition contrast).  On the inhibition switching combined with response inhibition trial her speed was high average and she was average for total errors.  Her overall pattern showed difficulty with response inhibition but not set shifting.  Her speed, by age and education, with with basic psychomotor sequencing was average, with and without set shifting.  She made only 1 error on the set shifting trial.  The patient had some more difficulty with semantic verbal fluency combined with set shifting, scoring below average for total words generated and low average for accuracy.  The patient completed a measure of applied reasoning, concept formation, and cognitive flexibility.  Her performance, normed by age and education, showed she was able to identify the conceptually based rule set and a typical number of trials, and that she had no difficulty with cognitive flexibility or set loss errors.  Nonperseverative errors were scored in the low average range but overall she showed no compelling signs of impairment.  MEMORY         Norm Score Percentile Range  BVMT-R                    Trial 1     t = 43.0 25 %ile Average    Trial 2     t = 36 8 %ile Below Average    Trial 3     t = 60.0 84 %ile High Average    Total Recall     t = 46.0 32 %ile Average    Learning     t = 68.0 96 %ile Above Average    Delayed Recall     t = 57.0 75 %ile High Average    % Retained          82% >16 %ile WNL  Hits           >16 %ile WNL    False Alarms           >16 %ile WNL    Recognition Discriminability           >16 %ile WNL  The patient completed 1 visually based memory test and to auditory/verbal memory tests.  The total amount of information that she learned across 3 learning trials of the visual memory test was scored in the average range.  She showed some variability, qualitatively, from trial to trial such that  while her initial recall on trial 1 was average she did not show significant improvement on trial 2.  Then, on trial 3 her performance improved dramatically and she accurately encoded nearly all figures in their locations.  Subsequently her technical learning performance was above average (gains from repetition).  Her delayed free recall scored in the high average range and demonstrated no significant information loss over time (82% retention).  All metrics of the recognition task were within normal limits. CVLT-III                    Trial 1     ss = 10.0 50 %ile Average    Trial 2     ss = 10.0 50 %ile Average    Trial 3     ss = 10.0 50 %ile Average    Trial 4     ss = 4.0 2 %ile Below Average    Trial 5     ss = 7.0 16 %ile Low Average    Trials 1-5 Total     SS = 90 25 %ile Average    Trial B     ss = 11.0 63 %ile Average    Short Delay Free Recall     ss = 8.0 25 %ile Average    Short Delay Cued Recall     ss = 10.0 50 %ile Average    Long Delay Free Recall     ss = 7.0 16 %ile Low Average    Long Delay Cued Recall     ss = 10.0 50 %ile Average    List B vs. Trial 1     ss = 11.0 63 %ile Average    LD vs. SD     ss = 7.0 16 %ile Low Average    SD vs. Trial 5     ss = 11.0 63 %ile Average    Total Repetitions     ss = 11.0 63 %ile Average    Total Intrusions     ss = 11.0 63 %ile Average    Total Hits     ss = 10.0 50 %ile Average    Total False Positives     ss = 8.0 25 %ile Average    Recognition Discriminability     ss = 8.0 25 %ile Average  Wechsler Memory Scale, 4th Edition (WMS-4)          Log. Mem. Immediate Recall     ss = 11 63 %ile Average    Logical Memory Delayed Recall ss = 12 75 %ile High Average    Logical Recognition         >75th %ile %ile  High Average   And verbal memory test involving a word list, the patient scored in the average range overall for encoding across 5 learning trials.  Again there was some notable variability between trials  such that she did not  consistently show gains with repetition.  She had no difficulty with immediate recall for an interference list.  After a short delay she freely recalled an average number of words, and she was average with cued recall.  Her free recall after a long delay was low average and cued recall was average.  Qualitatively she did not clearly demonstrate evidence of information loss between short and long delays as the total number words she recalled did not change (7).  Her performance on the delayed recognition task was average on all metrics.  Analysis of her processes scores (not shown) showed limited use of categorical classification for encoding in favor of serial order memorization.  On a prose memory test, presented verbally, the patient was average for immediate recall, high average for delayed free recall, and high average for recognition.  Overall her performances show some variability in immediate recall that may be secondary to attention given the absence of executively based memory deficits.  VISUAL-SPATIAL         Norm Score Percentile Range  WAIS-IV                    Block Design     ss = 11 63 %ile Average  Benton JOLO           86 %ile High Average  Rey Complex Figure Copy           11 to 16 %ile  Low Average  Clock               WNL  The patient's performance on visual spatial tests showed no clear indications of impairment.  Her lowest performance involved a copy of a complex geometric figure with which she scored in the low average range.  Her performance on this test was primarily impacted by relatively minor attention to detail/accuracy errors.  Her clock drawing was unremarkable.  Her performance on a visual perception task requiring judgment of line orientation was high average.  On a timed visual construction task requiring her to replicated 2-dimensional figure using three-dimensional blocks she scored within the average range.  PERSONALITY AND BEHAVIORAL FUNCTIONING      Score/Interpretation  BDI Raw               19  BDI Severity               Mild.  BAI Raw               5  BAI Severity               Minimal.  The patient's endorsements on self-report measures of depression and anxiety indicated mild depressive symptoms and no clinically significant symptoms of anxiety.   SUMMARY / CLINICAL IMPRESSIONS  The patient was referred for neuropsychological evaluation by her neurologist, Dr. Gennaro Africa, due to concerns for memory decline.  Notes indicate she is intact with basic and instrumental activities of daily living but has had difficulties at work leading to increased stress. Difficulties with focus and task completion at home were reported as well.  The patient has a past medical history of anxiety and ADHD. Given concerns for progressive memory decline, the patient was referred for neuropsychological evaluation and an MRI of the brain. Results from the MRI, per records, were reported to be unremarkable.  Upon interview the patient reported relatively rapid, but not sudden, onset of cognitive symptoms approximately 3 years ago which have remained  largely stable.  She noted some variability symptom severity related to stress.  Significant psychosocial stressors experienced by the patient include considerable changes to her work environment.  She rates current stress as an 8 out of 10 on average.  The patient endorses sleep difficulties which have become more problematic over the last several weeks.  The patient reported some initial benefits from Adderall medication which she began around 3 years ago but declines and symptom control which have not improved with dose increases.  The patient's cognitive profile showed considerable variability within most cognitive domains but no broad-based domain wide deficits.  There was significant variability across basic attentional measures that itself may reflect struggles with attention/concentration, and which is consistent  with her performance on a continuous performance test which showed difficulties with sustained attention.  Similarly, inconsistencies in her learning trial performances on memory testing are suggestive of attentional interference.  On measures of executive functioning she did appear to have increased difficulty with basic response inhibition but not with set shifting or more complex applied reasoning.  Her memory testing profile showed no indications of rapid information loss over time.  Her performance was a bit stronger with a prose memory test relative to a word list and, when combined with process scores from the word list test, suggest some weaknesses in effectively organizing to be learned information.  The patient largely had no difficulty with visual spatial tasks, processing speed, or language.  Motor functioning was largely unremarkable.  Overall, the patient's cognitive profile is not suggestive of a neurodegenerative disease process.  Her intact language functioning and memory profile is inconsistent with Alzheimer disease.  While executive dysfunction and attentional difficulties are common with cerebrovascular disease, her processing speed and, at times, above average working memory scores and absence of abnormal imaging findings are not consistent with cerebrovascular disease. Similarly, the patient's reported symptoms and cognitive profile are not suggestive of other, less common, neurodegenerative conditions.   The stability of the patient's cognitive symptoms, combined with an onset that aligns with significant psychosocial stressors (and now chronic stressors), are notable. Her cognitive profile could be accounted for by chronic stress and changes in mood, which are readily able to interfere with cognitive functioning. These difficulties are likely to be exacerbated by sleep disturbances. Furthermore, any premorbid cognitive difficulties that exist are likely to be amplified in these  circumstances. It is likely that the cognitive changes experienced by the patient will improve upon improvements in stress. Although findings are reassuring from with respect to concern for neurodegenerative diseases, a re-evaluation would be appropriate should difficulties persist even after these other factors improve, if desired.   Diagnosis:    Recommendations:   This report was generated using voice recognition software. While this document has been carefully reviewed, transcription errors may be present. I apologize in advance for any inconvenience. Please contact me if further clarification is needed.             Thelma Comp, PsyD             Neuropsychologist

## 2023-04-25 NOTE — Progress Notes (Addendum)
 Behavioral Observations: The patient appeared well-groomed and appropriately dressed. Her manners were polite and appropriate to the situation. The patient's attitude towards testing was positive and her effort was good. The patient did not appear anxious or frustrated during testing and she was not easily distractible. The patient did not seem to have any difficulty with understanding instructions. No issues of gait or mobility were noted.   Neuropsychology Note  Isabel Wood completed 145 minutes of neuropsychological testing with technician, Staci Acosta, BA, under the supervision of Doy Mince, PsyD., Clinical Neuropsychologist. The patient did not appear overtly distressed by the testing session, per behavioral observation or via self-report to the technician. Rest breaks were offered.   Clinical Decision Making: In considering the patient's current level of functioning, level of presumed impairment, nature of symptoms, emotional and behavioral responses during clinical interview, level of literacy, and observed level of motivation/effort, a battery of tests was selected by Dr. Matt Holmes during initial consultation on 04/17/2023. This was communicated to the technician. Communication between the neuropsychologist and technician was ongoing throughout the testing session and changes were made as deemed necessary based on patient performance on testing, technician observations and additional pertinent factors such as those listed above.  Tests Administered: Automatic Data Edition (BNT-2) Brief Visuospatial Memory Test-Revised (BVMT-R) Benton Judgment of Line Orientation (JOLO) DIRECTV Verbal Learning Test-Third Edition (CVLT-3) Clock Drawing Test Conners Continuous Performance Test 3rd Edition (CPT-3) Controlled Oral Word Association Test (FAS & Animals) Delis-Kaplan Executive Function System (D-KEFS), select subtests Grooved Pegboard Test Rey Complex Figure Test (RCFT),  select subtests Trail Making Test (TMT; Part A & B) Wechsler Adult Intelligence Scale-Fourth Edition (WAIS-IV), select subtests Wechsler Memory Scale-Third Edition (WMS-III), select subtests  Wechsler Test of Adult Reading (WTAR) [employer] [phone]) The Beck Depression Inventory-II (BDI-II) Beck Anxiety Inventory (BAI)  Results:  ESTIMATED PREMORBID COG.      Norm Score Percentile Range  Wechsler Test of Adult Reading   SS = 126 96 %ile Above Average   ATTENTION AND WORKING MEMORY  Norm Score Percentile Range  WAIS-IV           Digit Span   ss = 13 84 %ile High Average   DSF   ss = 14 91 %ile Above Average   Span:     8      DSB   ss = 13 84 %ile High Average   Span:     6      DSS   ss = 11 63 %ile Average   Span:     6     WMS-III           Spatial Span   ss = 11 63 %ile Average   SSF   ss = 6 9 %ile Low Average   Span:     5      SSB   ss = 15 95 %ile Above Average   Span:     6                 CPT-3         Response Style   Norm Score Percentile  Qualitative Descriptor   Detectability (reverse scored) t = 59 80 %ile Elevated   Omission Errors t = 55 83 %ile Slight Elevation   Commission Errors t = 53 68 %ile Average   Perseverations t = 52 77 %ile Average   HRT (reaction time) t = 68 98 %ile Slow  HRT SD (reaction time consistency) t = 56 78 %ile Slight Elevation   Variability (in RT consistency) t = 59 82 %ile Slight Elevation   HRT Block Change (over test) t = 65 94 %ile Elevated   HRT ISI Change (by stimuli interval) t = 65 93 %ile Elevated    PROCESSING SPEED     Norm Score Percentile Range  WAIS-IV           Coding   ss = 11 63 %ile Average   Symbol Search   ss = 15 95 %ile Above Average   PSYCHOMOTION     Norm Score Percentile Range  Grooved Pegboard - Dominant   t = 38 12 %ile Low Average   # of drops     1     Grooved Pegboard - Nondominant  t = 47 37 %ile Average   # of drops     0      LANGUAGE     Norm Score Percentile Range   Boston Naming Test (BNT-2)   t = 53 61 %ile Average  COWAT           FAS   t = 47 37 %ile Average   Animals   t = 53 61 %ile Average   EXECUTIVE FUNCTIONING     Norm Score Percentile Range  DKEFS - Color-Word Interference          Color Naming   ss = 12 75 %ile High Average   Word Reading   ss = 11 63 %ile Average   Inhibition   ss = 8 25 %ile Average   Errors   ss = 6 9 %ile Low Average   Inhibition Switching   ss = 13 84 %ile High Average   Errors   ss = 11 63 %ile Average   Inhibit - Color   ss =  #N/A %ile #N/A   Inhibition/Switching - Inhibit   ss =  #N/A %ile #N/A  Trails A   t = 49 45 %ile Average   Errors     0     Trails B   t = 46 32 %ile Average   Errors     1      DKEFS - Verbal Fluency           Category Switching   ss = 5 5 %ile Below Average   Switching Accuracy   ss = 6 9 %ile Low Average   Wisconsin Card Sorting Test (WCST)        Total Correct            WCST Total Errors   t = 44 27 %ile Average   WCST Perseverative Errors   t = 47 37 %ile Average   WCST Non-Perseverative Errors t = 39 13 %ile Low Average   WCST Conceptual Responses  t = 45 30 %ile Average   # Categories Completed      >16 %ile    Trials to First      >16 %ile    FTMS      >16 %ile      MEMORY     Norm Score Percentile Range  BVMT-R           Trial 1   t = 43.0 25 %ile Average   Trial 2   t = 36 8 %ile Below Average   Trial 3   t = 60.0 84 %ile High Average  Total Recall   t = 46.0 32 %ile Average   Learning   t = 68.0 96 %ile Above Average   Delayed Recall   t = 57.0 75 %ile High Average   % Retained       >16 %ile WNL   Hits      >16 %ile WNL   False Alarms      >16 %ile WNL   Recognition Discriminability      >16 %ile WNL  CVLT-III           Trial 1   ss = 10.0 50 %ile Average   Trial 2   ss = 10.0 50 %ile Average   Trial 3   ss = 10.0 50 %ile Average   Trial 4   ss = 4.0 2 %ile Below Average   Trial 5   ss = 7.0 16 %ile Low Average   Trials 1-5 Total   SS = 90 25 %ile  Average   Trial B   ss = 11.0 63 %ile Average   Short Delay Free Recall   ss = 8.0 25 %ile Average   Short Delay Cued Recall   ss = 10.0 50 %ile Average   Long Delay Free Recall   ss = 7.0 16 %ile Low Average   Long Delay Cued Recall   ss = 10.0 50 %ile Average   List B vs. Trial 1   ss = 11.0 63 %ile Average   LD vs. SD   ss = 7.0 16 %ile Low Average   SD vs. Trial 5   ss = 11.0 63 %ile Average   Total Repetitions   ss = 11.0 63 %ile Average   Total Intrusions   ss = 11.0 63 %ile Average   Total Hits   ss = 10.0 50 %ile Average   Total False Positives   ss = 8.0 25 %ile Average   Recognition Discriminability   ss = 8.0 25 %ile Average  Wechsler Memory Scale, 4th Edition (WMS-4)      Log. Mem. Immediate Recall   ss = 11 63 %ile Average   Logical Memory Delayed Recall ss = 12 75 %ile High Average   Logical Recognition      >75th %ile %ile    VISUAL-SPATIAL     Norm Score Percentile Range  WAIS-IV           Block Design   ss = 11 63 %ile Average  Benton JOLO      86 %ile High Average  Rey Complex Figure Copy        11 to 16 %ile  Low Average  Clock        WNL   PERSONALITY AND BEHAVIORAL FUNCTIONING   Score/Interpretation  BDI Raw        19  BDI Severity        Mild.  BAI Raw        5  BAI Severity        Minimal.     Feedback to Patient: Isabel Wood will return on 04/30/2023 for an interactive feedback session with Dr. Matt Holmes at which time her test performances, clinical impressions and treatment recommendations will be reviewed in detail. The patient understands she can contact our office should she require our assistance before this time.  145 minutes spent face-to-face with patient administering standardized tests, 30 minutes spent scoring Radiographer, therapeutic). [CPT [id number], [id number]]  Full report to follow.

## 2023-04-30 ENCOUNTER — Encounter: Payer: BC Managed Care – PPO | Admitting: Psychology

## 2023-05-08 ENCOUNTER — Encounter: Admitting: Psychology

## 2023-05-14 ENCOUNTER — Encounter: Admitting: Psychology

## 2023-05-14 DIAGNOSIS — R4189 Other symptoms and signs involving cognitive functions and awareness: Secondary | ICD-10-CM | POA: Diagnosis not present

## 2023-05-15 NOTE — Progress Notes (Signed)
   NEUROPSYCHOLOGICAL EVALUATION [place]  Physical Medicine and Rehabilitation     Patient: Isabel Wood  MRN: [id number] DOB: 1960-05-19   Service Provider/Clinical Neuropsychologist: Thelma Comp, PsyD  Date of Service: 05/14/23 Start Time: 4:00 PM End Time: 5:00 PM  Location of Service:  [facility] Physical Medicine & Rehabilitation Department [place] [address] [place], [place] [address] Phone: [phone]   Billing Code/Service: 96132/ interactive feedback appointment for neuropsychological evaluation   Individuals present: Patient was seen unaccompanied by the provider in the providers office, face-to-face, for 60 minutes for the feedback appointment.    The provider reviewed and discussed the results of neuropsychological testing evaluation with the patient in full. This included overall findings, diagnosis, and treatment planning/recommendations that were derived from integration of patient data, interpretation of standardized rest results and clinical data, and clinical decision making, which are documented in the patient's electronic medical record (encounter date listed below)  The patient expressed understanding of the information reviewed. The patient was provided opportunity to ask questions which were answered by the provider. The provider worked collaboratively to tailor treatment recommendations to the patient when possible. The patient was informed they would be provided a copy of the full report and the patient was encouraged to reach out to the provider with any additional questions that might arise relating to the evaluation and findings.   The final neuropsychological evaluation report, documented in the patient's chart dated 04/25/23, was amended to reflect any additional information obtained during the feedback appointment included treatment planning collaboration.    This report was  generated using voice recognition software. While this document has been carefully reviewed, transcription errors may be present. I apologize in advance for any inconvenience. Please contact me if further clarification is needed.             Thelma Comp, PsyD             Neuropsychologist

## 2023-06-12 ENCOUNTER — Encounter: Payer: BC Managed Care – PPO | Admitting: Psychology

## 2023-06-25 ENCOUNTER — Telehealth: Payer: Self-pay | Admitting: Neurology

## 2023-06-25 ENCOUNTER — Ambulatory Visit: Admitting: Neurology

## 2023-06-25 NOTE — Telephone Encounter (Signed)
 LVM and sent mychart msg informing pt of need to reschedule 06/25/23 appt - MD out

## 2023-07-17 ENCOUNTER — Ambulatory Visit: Payer: BC Managed Care – PPO | Admitting: Neurology

## 2023-07-24 ENCOUNTER — Encounter: Payer: Self-pay | Admitting: Neurology

## 2023-07-24 ENCOUNTER — Ambulatory Visit: Admitting: Neurology

## 2023-07-24 VITALS — BP 115/72 | HR 102 | Resp 15 | Ht 69.0 in | Wt 211.5 lb

## 2023-07-24 DIAGNOSIS — R419 Unspecified symptoms and signs involving cognitive functions and awareness: Secondary | ICD-10-CM

## 2023-07-24 DIAGNOSIS — R413 Other amnesia: Secondary | ICD-10-CM | POA: Diagnosis not present

## 2023-07-24 NOTE — Progress Notes (Signed)
 [facility]  PATIENT: Isabel Wood DOB: 07/14/1960  REQUESTING CLINICIAN: Darnelle Elders, PA-C HISTORY FROM: Patient  REASON FOR VISIT: Memory decline    HISTORICAL  CHIEF COMPLAINT:  Chief Complaint  Patient presents with   Memory Loss    Rm12, alone, Memory loss: moca score of 28    INTERVAL HISTORY 07/24/2023 Patient presents today for follow-up, last visit was a year ago, since then she has been doing well.  She completed her full neuropsychological testing and was told that she has a normal test score and normal cognition.  She tells me that she is still independent in all of her ADLs, nothing has changed.  Her main complaint now is mild insomnia.  She tells me that she used to go to bed around 9 PM but now she cannot sleep until midnight which caused her to wake up late.  On weekends she does sleep in late.  No other complaints, no other concern.   INTERVAL HISTORY 07/18/2022 Patient presents for follow-up, last visit was a year ago.  Since then she reports that her short-term memory is getting worse.  At last visit we have referred her to for formal neuropsychological testing, it was not completed.  She is still forgetful.  She is still independent, lives alone, independent all actives of daily living.  She still works, denies being reprimanded, denies having difficulty completing her work.      HISTORY OF PRESENT ILLNESS:  This is a 63 year old woman past medical history of anxiety and ADHD who is presenting with memory concern for the past year and a half 2 years ago.  She has been following up with psychiatry in regard of anxiety and ADHD.  She mention lately she has trouble with detail orientation which started to impact her job.  She said her job is very detail oriented, she worked for the [employer].  She has trouble with her meeting at the end of the day, she has decrease in productivity, she said during her meeting she sometimes has  inability to answer questions which is again causing her high stress.  She mentioned that coworker had noted some mistake in her inability to follow along in meetings. She also reports a sense of being forgetful and repeating herself. There is also report of inability to focus to get things done around the house.  She reports her short-term memory is for the most part okay, she is still independent, lives by herself, independent in all activities of daily living, still drives, denies being lost in familiar places.   TBI:   No past history of TBI Stroke:   no past history of stroke Seizures:   no past history of seizures Sleep:   no history of sleep apnea.  Mood:  patient denies anxiety and depression  Functional status: independent in all ADLs and IADLs Patient lives alone.   Cooking: Patient  Cleaning: Patient  Shopping: Patient  Bathing: Patient Toileting: Patient  Driving: Patient, denies being loss in familiar place, no recent accidents Bills: Patient  Medications: Antidepressant and Amphetamine/Dextroamphetamine  Ever left the stove on by accident?: Forget to take food out of the [employer] how to use items around the house?: no Getting lost going to familiar places?: no Forgetting loved ones names?:  Struggle with names  Word finding difficulty? Yes  Sleep: Good    OTHER MEDICAL CONDITIONS: Anxiety and ADHD    REVIEW OF SYSTEMS: Full 14 system review of systems performed and negative  with exception of: as noted in the HPI   ALLERGIES: No Known Allergies  HOME MEDICATIONS: Outpatient Medications Prior to Visit  Medication Sig Dispense Refill   amphetamine-dextroamphetamine (ADDERALL) 20 MG tablet Take 20 mg by mouth 3 (three) times daily.     buPROPion (WELLBUTRIN XL) 300 MG 24 hr tablet Take 300 mg by mouth daily.     amphetamine-dextroamphetamine (ADDERALL XR) 10 MG 24 hr capsule Take 10 mg by mouth every morning.     amphetamine-dextroamphetamine (ADDERALL XR) 20  MG 24 hr capsule Take 20 mg by mouth 2 (two) times daily.     ARIPiprazole (ABILIFY) 5 MG tablet Take 5 mg by mouth daily.     No facility-administered medications prior to visit.    PAST MEDICAL HISTORY: History reviewed. No pertinent past medical history.  PAST SURGICAL HISTORY: History reviewed. No pertinent surgical history.  FAMILY HISTORY: History reviewed. No pertinent family history.  SOCIAL HISTORY: Social History   Socioeconomic History   Marital status: Widowed    Spouse name: Not on file   Number of children: 1   Years of education: Not on file   Highest education level: Some college, no degree  Occupational History   Not on file  Tobacco Use   Smoking status: Never    Passive exposure: Never   Smokeless tobacco: Never  Vaping Use   Vaping status: Never Used  Substance and Sexual Activity   Alcohol use: Not on file   Drug use: Not Currently   Sexual activity: Not Currently  Other Topics Concern   Not on file  Social History Narrative   Not on file   Social Drivers of Health   Financial Resource Strain: Not on file  Food Insecurity: Not on file  Transportation Needs: Not on file  Physical Activity: Not on file  Stress: Not on file  Social Connections: Unknown (06/30/2021)   Received from [facility]   Social Network    Social Network: Not on file  Intimate Partner Violence: Unknown (05/23/2021)   Received from [facility], [facility]   HITS    Physically Hurt: Not on file    Insult or Talk Down To: Not on file    Threaten Physical Harm: Not on file    Scream or Curse: Not on file    PHYSICAL EXAM  GENERAL EXAM/CONSTITUTIONAL: Vitals:  Vitals:   07/24/23 1344  BP: 115/72  Pulse: (!) 102  Resp: 15  SpO2: 96%  Weight: 211 lb 8 oz (95.9 kg)  Height: 5\' 9"  (1.753 m)   Body mass index is 31.23 kg/m. Wt Readings from Last 3 Encounters:  07/24/23 211 lb 8 oz (95.9 kg)  07/18/22 238 lb (108 kg)  07/13/21 230 lb 8 oz  (104.6 kg)   Patient is in no distress; well developed, nourished and groomed; neck is supple  MUSCULOSKELETAL: Gait, strength, tone, movements noted in Neurologic exam below  NEUROLOGIC: MENTAL STATUS:      No data to display            07/24/2023    1:46 PM 07/18/2022    2:59 PM 07/13/2021    2:12 PM  Montreal Cognitive Assessment   Visuospatial/ Executive (0/5) 4 5 5   Naming (0/3) 3 3 3   Attention: Read list of digits (0/2) 2 2 2   Attention: Read list of letters (0/1) 1 1 1   Attention: Serial 7 subtraction starting at 100 (0/3) 3 3 3   Language: Repeat phrase (0/2)  2 2 2   Language : Fluency (0/1) 1 1 1   Abstraction (0/2) 2 2 2   Delayed Recall (0/5) 4 2 2   Orientation (0/6) 6 6 6   Total 28 27 27   Adjusted Score (based on education) 28 27 27      CRANIAL NERVE:  2nd, 3rd, 4th, 6th -visual fields full to confrontation, extraocular muscles intact, no nystagmus 5th - facial sensation symmetric 7th - facial strength symmetric 8th - hearing intact 9th - palate elevates symmetrically, uvula midline 11th - shoulder shrug symmetric 12th - tongue protrusion midline  MOTOR:  normal bulk and tone, full strength in the BUE, BLE  SENSORY:  normal and symmetric to light touch, vibration  COORDINATION:  finger-nose-finger, fine finger movements normal  REFLEXES:  deep tendon reflexes present and symmetric  GAIT/STATION:  normal   DIAGNOSTIC DATA (LABS, IMAGING, TESTING) - I reviewed patient records, labs, notes, testing and imaging myself where available.  No results found for: "WBC", "HGB", "HCT", "MCV", "PLT" No results found for: "NA", "K", "CL", "CO2", "GLUCOSE", "BUN", "CREATININE", "CALCIUM", "PROT", "ALBUMIN", "AST", "ALT", "ALKPHOS", "BILITOT", "GFRNONAA", "GFRAA" No results found for: "CHOL", "HDL", "LDLCALC", "LDLDIRECT", "TRIG", "CHOLHDL" No results found for: "HGBA1C" Lab Results  Component Value Date   VITAMINB12 >2000 (H) 07/13/2021   Lab Results   Component Value Date   TSH 1.210 07/13/2021   SUMMARY / CLINICAL IMPRESSIONS The patient was referred for neuropsychological evaluation by her neurologist, Dr. Cassandra Cleveland, due to concerns for memory decline.  She is is intact with basic and instrumental activities of daily living but has had difficulties at work leading to increased stress. Difficulties with focus and task completion at home were reported as well.  The patient has a past medical history of anxiety and ADHD. Results from the MRI, per records, were reported to be unremarkable.  The patient reported relatively rapid, but not sudden, onset of cognitive symptoms approximately 3 years ago and which have remained largely stable.  She noted some variability symptom severity related to stress.  Significant psychosocial stressors experienced by the patient include considerable changes to her work environment.  She rates current stress as an 8 out of 10 on average.  The patient endorses sleep difficulties which have become more problematic over the last several weeks.    The patient's cognitive profile showed variability within most cognitive domains but no clear pattern of marked impairment overall, or consistently across measures of specific cognitive domains. The data shows evidence of attention related difficulties that, when measured directly, are most evident within a sustained performance measure.  Interference from disruptions in attention also aligned with her profile on memory testing (atypical learning curve/variable immediate recall between learning trials).  Overall variability between measures within cognitive domains is also suggestive of problems secondary to attention.  Within executive functioning, she demonstrated response-inhibition difficulties on one of two measures, and set-shifting difficulties on one of three measures. However, she showed no clear impairments on a composite executive measure of implied reasoning. Her  performance on memory testing showed no compelling evidence of rapid information loss over time. As noted, she was variable with respect to immediate recall across learning trials. She showed a slight improvement in recognition versus free recall with unstructured verbal information, but overall showed limited evidence of retrieval / frontally based memory deficits. Despite some within domain variability, she did not show compelling evidence of impairment in visual-spatial functioning, processing speed, or motor functioning. No problems were seen in language functioning.    Overall,  the patient's cognitive profile is not suggestive of a neurodegenerative disease process.  Findings do not align with deficit patterns frequently seen in Alzheimer's pathologies. While executive and attention deficits are more common in cerebrovascular and FTD pathologies, other aspects of her profile and symptom presentations are not suggestive of those or other less common neurodegenerative conditions. Normal brain imaging findings are also reassuring. The most likely cause of the patient's cognitive symptoms involve interference from chronic stress/mood. Onset of symptoms aligns with the profound and challenging changes in her workplace. Any premorbid cognitive weaknesses in attention and concentration easily exacerbated by this. Similarly, she reports sleep disturbances which will further degrade her cognitive functioning. It is likely that the cognitive changes experienced by the patient will improve upon improvements in stress. Although findings are reassuring from with respect to concern for neurodegenerative diseases, a re-evaluation would be appropriate should difficulties persist even after these other factors improve, if desired.    Diagnosis: No formal cognitive diagnosis   ASSESSMENT AND PLAN  63 y.o. year old female with anxiety/depression, ADD who is following up for memory decline.  She completed full  neuropsychological testing and diagnosed with normal cognition.  Plan for now is for Patient to continue follow-up with PCP, I have advised her to increase her exercise, keeping a good sleep, good diet, and maintaining good health.  She can also consider Melatonin for her sleep concerns. Return as needed.   1. Memory change   2. Cognitive complaints with normal exam     Patient Instructions  Continue current medications  Continue to follow up PCP  Consider Melatonin as a sleep aid  Return as needed   No orders of the defined types were placed in this encounter.   No orders of the defined types were placed in this encounter.   Return if symptoms worsen or fail to improve.  I personally spent a total of 30 minutes in the care of the patient today including preparing to see the patient, getting/reviewing separately obtained history, performing a medically appropriate exam/evaluation, counseling and educating, referring and communicating with other health care professionals, documenting clinical information in the EHR, independently interpreting results, and communicating results.   Cassandra Cleveland, MD 07/24/2023, 5:58 PM  [facility] [address] [place], [place] [address] [phone]

## 2023-07-24 NOTE — Patient Instructions (Signed)
 Continue current medications  Continue to follow up PCP  Consider Melatonin as a sleep aid  Return as needed
